# Patient Record
Sex: Female | Born: 1975 | Race: Black or African American | Hispanic: No | Marital: Single | Smoking: Never smoker
Health system: Southern US, Community
[De-identification: ages and names within clinical notes are randomized; demographics above are authoritative.]

## PROBLEM LIST (undated history)

## (undated) ENCOUNTER — Emergency Department (HOSPITAL_COMMUNITY): Disposition: A | Discharge status: Home / Self Care | Payer: Self-pay

## (undated) DIAGNOSIS — N921 Excessive and frequent menstruation with irregular cycle: Secondary | ICD-10-CM

## (undated) DIAGNOSIS — H9190 Unspecified hearing loss, unspecified ear: Secondary | ICD-10-CM

## (undated) DIAGNOSIS — D219 Benign neoplasm of connective and other soft tissue, unspecified: Secondary | ICD-10-CM

## (undated) DIAGNOSIS — IMO0002 Reserved for concepts with insufficient information to code with codable children: Secondary | ICD-10-CM

## (undated) DIAGNOSIS — IMO0001 Reserved for inherently not codable concepts without codable children: Secondary | ICD-10-CM

## (undated) HISTORY — DX: Excessive and frequent menstruation with irregular cycle: N92.1

---
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
## 2002-07-03 HISTORY — PX: MYOMECTOMY ABDOMINAL APPROACH: SUR870

## 2010-05-13 ENCOUNTER — Inpatient Hospital Stay (HOSPITAL_COMMUNITY): Admission: AD | Admit: 2010-05-13 | Discharge: 2010-05-13 | Payer: Self-pay | Admitting: Obstetrics & Gynecology

## 2010-05-14 ENCOUNTER — Inpatient Hospital Stay (HOSPITAL_COMMUNITY): Admission: AD | Admit: 2010-05-14 | Discharge: 2010-05-14 | Payer: Self-pay | Admitting: Obstetrics and Gynecology

## 2010-06-15 ENCOUNTER — Ambulatory Visit: Payer: Self-pay | Admitting: Obstetrics & Gynecology

## 2011-01-26 ENCOUNTER — Emergency Department (HOSPITAL_COMMUNITY)
Admission: EM | Admit: 2011-01-26 | Discharge: 2011-01-27 | Disposition: A | Discharge status: Home / Self Care | Payer: Medicaid Other | Attending: Emergency Medicine | Admitting: Emergency Medicine

## 2011-01-26 ENCOUNTER — Emergency Department (HOSPITAL_COMMUNITY): Payer: Medicaid Other

## 2011-01-26 DIAGNOSIS — R071 Chest pain on breathing: Secondary | ICD-10-CM | POA: Insufficient documentation

## 2011-01-26 DIAGNOSIS — X58XXXA Exposure to other specified factors, initial encounter: Secondary | ICD-10-CM | POA: Insufficient documentation

## 2011-01-26 DIAGNOSIS — M546 Pain in thoracic spine: Secondary | ICD-10-CM | POA: Insufficient documentation

## 2011-01-26 DIAGNOSIS — H9190 Unspecified hearing loss, unspecified ear: Secondary | ICD-10-CM | POA: Insufficient documentation

## 2011-01-26 DIAGNOSIS — S239XXA Sprain of unspecified parts of thorax, initial encounter: Secondary | ICD-10-CM | POA: Insufficient documentation

## 2011-01-26 LAB — COMPREHENSIVE METABOLIC PANEL
AST: 14 U/L (ref 0–37)
Albumin: 3.6 g/dL (ref 3.5–5.2)
Alkaline Phosphatase: 94 U/L (ref 39–117)
CO2: 24 mEq/L (ref 19–32)
Chloride: 103 mEq/L (ref 96–112)
GFR calc non Af Amer: >60 mL/min (ref >60)
Potassium: 3.6 mEq/L (ref 3.5–5.1)
Total Bilirubin: 0.1 mg/dL — ABNORMAL LOW (ref 0.3–1.2)

## 2011-01-26 LAB — CBC
Hemoglobin: 11.2 g/dL — ABNORMAL LOW (ref 12.0–15.0)
MCH: 23.8 pg — ABNORMAL LOW (ref 26.0–34.0)
MCV: 71.3 fL — ABNORMAL LOW (ref 78.0–100.0)
Platelets: 211 10*3/uL (ref 150–400)
RBC: 4.71 MIL/uL (ref 3.87–5.11)
WBC: 7.5 10*3/uL (ref 4.0–10.5)

## 2011-01-26 LAB — DIFFERENTIAL
Eosinophils Absolute: 0.1 10*3/uL (ref 0.0–0.7)
Lymphs Abs: 2 10*3/uL (ref 0.7–4.0)
Monocytes Relative: 5 % (ref 3–12)
Neutro Abs: 5 10*3/uL (ref 1.7–7.7)
Neutrophils Relative %: 67 % (ref 43–77)

## 2011-01-26 LAB — D-DIMER, QUANTITATIVE: D-Dimer, Quant: 0.26 ug/mL-FEU (ref 0.00–0.48)

## 2011-06-24 ENCOUNTER — Encounter: Payer: Self-pay | Admitting: *Deleted

## 2011-06-24 ENCOUNTER — Emergency Department (INDEPENDENT_AMBULATORY_CARE_PROVIDER_SITE_OTHER)
Admission: EM | Admit: 2011-06-24 | Discharge: 2011-06-24 | Disposition: A | Discharge status: Home Health | Payer: Medicaid Other | Source: Home / Self Care | Attending: Emergency Medicine | Admitting: Emergency Medicine

## 2011-06-24 DIAGNOSIS — N912 Amenorrhea, unspecified: Secondary | ICD-10-CM

## 2011-06-24 DIAGNOSIS — R51 Headache: Secondary | ICD-10-CM

## 2011-06-24 DIAGNOSIS — R5383 Other fatigue: Secondary | ICD-10-CM

## 2011-06-24 DIAGNOSIS — N926 Irregular menstruation, unspecified: Secondary | ICD-10-CM

## 2011-06-24 HISTORY — DX: Unspecified hearing loss, unspecified ear: H91.90

## 2011-06-24 HISTORY — DX: Reserved for concepts with insufficient information to code with codable children: IMO0002

## 2011-06-24 LAB — POCT I-STAT, CHEM 8
BUN: 17 mg/dL (ref 6–23)
Calcium, Ion: 1.14 mmol/L (ref 1.12–1.32)
Chloride: 105 mEq/L (ref 96–112)
Creatinine, Ser: 0.9 mg/dL (ref 0.50–1.10)
Glucose, Bld: 95 mg/dL (ref 70–99)
HCT: 41 % (ref 36.0–46.0)
Hemoglobin: 13.9 g/dL (ref 12.0–15.0)
Potassium: 4.1 mEq/L (ref 3.5–5.1)
Sodium: 138 mEq/L (ref 135–145)
TCO2: 27 mmol/L (ref 0–100)

## 2011-06-24 LAB — WET PREP, GENITAL
Clue Cells Wet Prep HPF POC: NONE SEEN
Trich, Wet Prep: NONE SEEN
WBC, Wet Prep HPF POC: NONE SEEN
Yeast Wet Prep HPF POC: NONE SEEN

## 2011-06-24 LAB — POCT URINALYSIS DIP (DEVICE)
Bilirubin Urine: NEGATIVE
Glucose, UA: NEGATIVE mg/dL
Ketones, ur: NEGATIVE mg/dL
Leukocytes, UA: NEGATIVE
Nitrite: NEGATIVE
Protein, ur: NEGATIVE mg/dL
Specific Gravity, Urine: 1.02 (ref 1.005–1.030)
Urobilinogen, UA: 0.2 mg/dL (ref 0.0–1.0)
pH: 5.5 (ref 5.0–8.0)

## 2011-06-24 LAB — POCT PREGNANCY, URINE: Preg Test, Ur: NEGATIVE

## 2011-06-24 LAB — TSH: TSH: 2.123 u[IU]/mL (ref 0.350–4.500)

## 2011-06-24 MED ORDER — ACETAMINOPHEN-CODEINE #2 300-15 MG PO TABS: 1.0000 | ORAL_TABLET | ORAL | Status: AC | PRN

## 2011-06-24 NOTE — ED Notes (Signed)
Deaf interpretor Tobi Bastos here with pt - [facility]

## 2011-06-24 NOTE — ED Provider Notes (Signed)
History     CSN: [id number]  Arrival date & time 06/24/11  1423   First MD Initiated Contact with Patient 06/24/11 1528      Chief Complaint  Patient presents with  . Headache  . Abdominal Pain  . Fatigue    (Consider location/radiation/quality/duration/timing/severity/associated sxs/prior treatment) Patient is a 35 y.o. female presenting with headaches and abdominal pain. The history is provided by the patient. Language Interpreter Used: Deaf- interpretor during interview and exam-  Headache The primary symptoms include headaches. The symptoms began more than 1 week ago. The symptoms are waxing and waning.  The headache is associated with weakness.  Additional symptoms include weakness and pain. Additional symptoms do not include irritability. Medical issues do not include seizures, drug use, hypertension or recent surgery.  Abdominal Pain The primary symptoms of the illness include abdominal pain, fatigue, vaginal discharge and vaginal bleeding. The primary symptoms of the illness do not include shortness of breath.  Symptoms associated with the illness do not include diaphoresis.    Past Medical History  Diagnosis Date  . Ulcer   . Deaf     History reviewed. No pertinent past surgical history.  History reviewed. No pertinent family history.  History  Substance Use Topics  . Smoking status: Never Smoker   . Smokeless tobacco: Not on file  . Alcohol Use: No    OB History    Grav Para Term Preterm Abortions TAB SAB Ect Mult Living                  Review of Systems  Constitutional: Positive for fatigue. Negative for diaphoresis and irritability.  HENT: Negative for neck pain.   Eyes: Negative for visual disturbance.  Respiratory: Negative for shortness of breath.   Cardiovascular: Negative for chest pain.  Gastrointestinal: Positive for abdominal pain.  Genitourinary: Positive for vaginal bleeding, vaginal discharge and menstrual problem. Negative for vaginal  pain.  Musculoskeletal: Negative for arthralgias and gait problem.  Neurological: Positive for weakness and headaches. Negative for tremors, syncope and numbness.    Allergies  Review of patient's allergies indicates no known allergies.  Home Medications   Current Outpatient Rx  Name Route Sig Dispense Refill  . ACETAMINOPHEN-CODEINE #2 300-15 MG PO TABS Oral Take 1 tablet by mouth every 4 (four) hours as needed for pain. 30 tablet 0    BP 129/78  Pulse 73  Temp(Src) 98.1 F (36.7 C) (Oral)  Resp 20  SpO2 100%  LMP 05/30/2011  Physical Exam  Nursing note and vitals reviewed. Constitutional: She appears well-developed and well-nourished. No distress.  HENT:  Head: Normocephalic.  Eyes: No scleral icterus.  Neck: No JVD present.  Cardiovascular: Normal rate.   No murmur heard. Pulmonary/Chest: Effort normal. No respiratory distress. She has no wheezes. She has no rales. She exhibits no tenderness.  Abdominal: Soft. She exhibits no distension and no mass. There is no tenderness. There is no guarding. Hernia confirmed negative in the right inguinal area and confirmed negative in the left inguinal area.  Genitourinary: There is bleeding around the vagina. No signs of injury around the vagina.  Lymphadenopathy:    She has no cervical adenopathy.  Skin: Skin is warm.    ED Course  Procedures (including critical care time)  Labs Reviewed  POCT URINALYSIS DIP (DEVICE) - Abnormal; Notable for the following:    Hgb urine dipstick LARGE (*)    All other components within normal limits  POCT I-STAT, CHEM 8  POCT PREGNANCY,  URINE  WET PREP, GENITAL  POCT URINALYSIS DIPSTICK  POCT PREGNANCY, URINE  I-STAT, CHEM 8  TSH  GC/CHLAMYDIA PROBE AMP, GENITAL   No results found.   1. Menstrual disorder   2. Headache   3. Fatigue       MDM  HA's and fatigue and irregular periods. Thyroid screening pending, discussed GN follow-up for comprehensive evaluation of irregular  menses.        Jimmie Molly, MD 06/24/11 [phone]

## 2011-06-24 NOTE — ED Notes (Signed)
Per pt onset of headache /generalized weakness/fatigue x 10 days - not eating well - per pt irregular menstrual cycle - 11/1st to 11/10  - 11/27 to 12/5  -  Onset of vaginal bleeding again today

## 2011-06-29 ENCOUNTER — Ambulatory Visit (INDEPENDENT_AMBULATORY_CARE_PROVIDER_SITE_OTHER): Payer: Self-pay | Admitting: Obstetrics and Gynecology

## 2011-06-29 ENCOUNTER — Encounter: Payer: Self-pay | Admitting: Obstetrics and Gynecology

## 2011-06-29 VITALS — BP 124/72 | HR 78 | Temp 96.8°F | Ht 65.0 in | Wt 170.5 lb

## 2011-06-29 DIAGNOSIS — N921 Excessive and frequent menstruation with irregular cycle: Secondary | ICD-10-CM

## 2011-06-29 DIAGNOSIS — N920 Excessive and frequent menstruation with regular cycle: Secondary | ICD-10-CM

## 2011-06-29 NOTE — Progress Notes (Signed)
S: Pt presents today for evaluation of heavy, irregular vag bleeding. She states she has had multiple episode of heavy vag bleeding in the past 2 months. Each time, she experiences pain and heavy bleeding with clots. She also states she feels weak and "just feels bad." She denies fever, dysuria, vag irritation, or any other sx at this time. O: VSS A&Ox3 in NAD Abd soft, slightly tender to palpation NL external genitalia Bimanual exam reveals uterus to be slightly enlarged and tender to palpation No obvious adnexal masses but tender to palpation bilaterally Labs reviewed from recent urgent care - NL TSH, neg GC/Chlamydia, NL chem-8. A/P: Menometrorrhagia: discussed with pt at length via interpreter. Will check CBC and get pelvic US. She will return about 2wks following the [place] for f/u. Discussed diet, activity, risks, and precautions.  Clinton Gallant. Nyajah Hyson III, DrHSc, MPAS, PA-C

## 2011-06-30 LAB — CBC
HCT: 36.3 % (ref 36.0–46.0)
MCV: 72.5 fL — ABNORMAL LOW (ref 78.0–100.0)
Platelets: 254 10*3/uL (ref 150–400)
RBC: 5.01 MIL/uL (ref 3.87–5.11)
WBC: 4.1 10*3/uL (ref 4.0–10.5)

## 2011-07-05 ENCOUNTER — Ambulatory Visit (HOSPITAL_COMMUNITY)
Admission: RE | Admit: 2011-07-05 | Discharge: 2011-07-05 | Disposition: A | Discharge status: Home / Self Care | Payer: Medicaid Other | Source: Ambulatory Visit | Attending: Obstetrics and Gynecology | Admitting: Obstetrics and Gynecology

## 2011-07-05 DIAGNOSIS — N938 Other specified abnormal uterine and vaginal bleeding: Secondary | ICD-10-CM | POA: Insufficient documentation

## 2011-07-05 DIAGNOSIS — N921 Excessive and frequent menstruation with irregular cycle: Secondary | ICD-10-CM

## 2011-07-05 DIAGNOSIS — N949 Unspecified condition associated with female genital organs and menstrual cycle: Secondary | ICD-10-CM | POA: Insufficient documentation

## 2011-07-17 ENCOUNTER — Encounter: Payer: Self-pay | Admitting: Obstetrics & Gynecology

## 2011-07-17 ENCOUNTER — Ambulatory Visit (INDEPENDENT_AMBULATORY_CARE_PROVIDER_SITE_OTHER): Payer: Medicaid Other | Admitting: Obstetrics & Gynecology

## 2011-07-17 VITALS — BP 127/74 | HR 84 | Temp 97.0°F | Wt 172.2 lb

## 2011-07-17 DIAGNOSIS — N920 Excessive and frequent menstruation with regular cycle: Secondary | ICD-10-CM

## 2011-07-17 DIAGNOSIS — N921 Excessive and frequent menstruation with irregular cycle: Secondary | ICD-10-CM | POA: Insufficient documentation

## 2011-07-17 HISTORY — DX: Excessive and frequent menstruation with irregular cycle: N92.1

## 2011-07-17 NOTE — Progress Notes (Signed)
History:  36 y.o.. here today for  followup evaluation for menometrorrhagia.  She is deaf and mute and there is a ASL interpreter present for this encounter.  She reports having a prolonged period in 06/2011 that lasted for two weeks, no other abnormal episodes.  No other symptoms.  The following portions of the patient's history were reviewed and updated as appropriate: allergies, current medications, past family history, past medical history, past social history, past surgical history and problem list.   Objective:  Physical Exam Blood pressure 127/74, pulse 84, temperature 97 F (36.1 C), temperature source Oral, weight 172 lb 3.2 oz (78.109 kg), last menstrual period 06/24/2011. Deferred  Labs and Imaging 07/05/2011 TRANSABDOMINAL AND TRANSVAGINAL ULTRASOUND OF PELVIS Technique:  Both transabdominal and transvaginal ultrasound examinations of the pelvis were performed. Transabdominal technique was performed for global imaging of the pelvis including uterus, ovaries, adnexal regions, and pelvic cul-de-sac.  Comparison: None.   It was necessary to proceed with endovaginal exam following the transabdominal exam to visualize the endometrium and adnexa.  Findings:  Uterus: Demonstrates a sagittal length of 8.9 cm, an AP depth of 5.3 cm and a transverse width of 7.1 cm.  The myometrium appears mildly heterogeneous with no focal parenchymal abnormalities seen  Endometrium: Is tri- layered with an AP width of 10 mm.  No areas of focal thickening or heterogeneity are identified and the appearance would correlate with a periovulatory endometrial stripe and correspond with the patient's given LMP of 06/24/2011  Right ovary:  Measures 4.2 x 2.3 x 2.7 cm and contains a dominant follicle  Left ovary: Measures 4.4 by 1.8 x 1.5 cm and has a normal appearance  Other findings: No pelvic fluid or separate adnexal masses are seen.  IMPRESSION: Normal periovulatory pelvic ultrasound.  POCT URINALYSIS DIP (DEVICE)   Collection Time   06/24/11  4:23 PM      Component Value Range   Glucose, UA NEGATIVE  NEGATIVE (mg/dL)   Bilirubin Urine NEGATIVE  NEGATIVE    Ketones, ur NEGATIVE  NEGATIVE (mg/dL)   Specific Gravity, Urine 1.020  1.005 - 1.030    Hgb urine dipstick LARGE (*) NEGATIVE    pH 5.5  5.0 - 8.0    Protein, ur NEGATIVE  NEGATIVE (mg/dL)   Urobilinogen, UA 0.2  0.0 - 1.0 (mg/dL)   Nitrite NEGATIVE  NEGATIVE    Leukocytes, UA NEGATIVE  NEGATIVE   TSH   Collection Time   06/24/11  4:24 PM      Component Value Range   TSH 2.123  0.350 - 4.500 (uIU/mL)  POCT I-STAT, CHEM 8   Collection Time   06/24/11  4:36 PM      Component Value Range   Sodium 138  135 - 145 (mEq/L)   Potassium 4.1  3.5 - 5.1 (mEq/L)   Chloride 105  96 - 112 (mEq/L)   BUN 17  6 - 23 (mg/dL)   Creatinine, Ser 1.61  0.50 - 1.10 (mg/dL)   Glucose, Bld 95  70 - 99 (mg/dL)   Calcium, Ion 0.96  0.45 - 1.32 (mmol/L)   TCO2 27  0 - 100 (mmol/L)   Hemoglobin 13.9  12.0 - 15.0 (g/dL)   HCT 40.9  81.1 - 91.4 (%)  POCT PREGNANCY, URINE   Collection Time   06/24/11  4:36 PM      Component Value Range   Preg Test, Ur NEGATIVE    GC/CHLAMYDIA PROBE AMP, GENITAL   Collection Time   06/24/11  5:03 PM      Component Value Range   GC Probe Amp, Genital NEGATIVE  NEGATIVE    Chlamydia, DNA Probe NEGATIVE  NEGATIVE   WET PREP, GENITAL   Collection Time   06/24/11  5:03 PM      Component Value Range   Yeast, Wet Prep NONE SEEN  NONE SEEN    Trich, Wet Prep NONE SEEN  NONE SEEN    Clue Cells, Wet Prep NONE SEEN  NONE SEEN    WBC, Wet Prep HPF POC NONE SEEN  NONE SEEN   CBC   Collection Time   06/29/11  2:03 PM      Component Value Range   WBC 4.1  4.0 - 10.5 (K/uL)   RBC 5.01  3.87 - 5.11 (MIL/uL)   Hemoglobin 11.7 (*) 12.0 - 15.0 (g/dL)   HCT 60.4  54.0 - 98.1 (%)   MCV 72.5 (*) 78.0 - 100.0 (fL)   MCH 23.4 (*) 26.0 - 34.0 (pg)   MCHC 32.2  30.0 - 36.0 (g/dL)   RDW 19.1  47.8 - 29.5 (%)   Platelets 254  150 - 400  (K/uL)     Assessment & Plan:  Menometrorrhagia, no structural anomalies, normal labs If bleeding persists, patient will need endometrial biopsy and further discussion about management. She declines any management for now. Bleeding precautions reviewed. Return for annual exam and pap smear

## 2011-07-17 NOTE — Patient Instructions (Signed)

## 2011-08-04 ENCOUNTER — Ambulatory Visit: Payer: Medicaid Other | Admitting: Obstetrics and Gynecology

## 2011-08-09 ENCOUNTER — Emergency Department (HOSPITAL_COMMUNITY)
Admission: EM | Admit: 2011-08-09 | Discharge: 2011-08-09 | Disposition: A | Discharge status: Home / Self Care | Payer: Medicaid Other | Attending: Emergency Medicine | Admitting: Emergency Medicine

## 2011-08-09 DIAGNOSIS — L259 Unspecified contact dermatitis, unspecified cause: Secondary | ICD-10-CM | POA: Insufficient documentation

## 2011-08-09 DIAGNOSIS — L298 Other pruritus: Secondary | ICD-10-CM | POA: Insufficient documentation

## 2011-08-09 DIAGNOSIS — R21 Rash and other nonspecific skin eruption: Secondary | ICD-10-CM | POA: Insufficient documentation

## 2011-08-09 DIAGNOSIS — L2989 Other pruritus: Secondary | ICD-10-CM | POA: Insufficient documentation

## 2011-08-09 DIAGNOSIS — L309 Dermatitis, unspecified: Secondary | ICD-10-CM

## 2011-08-09 MED ORDER — HYDROCORTISONE 2.5 % EX LOTN: TOPICAL_LOTION | Freq: Two times a day (BID) | CUTANEOUS | Status: DC

## 2011-08-09 NOTE — ED Notes (Signed)
Secretary was asked to call interpreter for american sign language on pt arrival to ED.  I am communicating with pt by writing and notified her that a interpreter has been called for her

## 2011-08-09 NOTE — ED Notes (Signed)
Pt is here for rash on her left leg, this is a chronic issue she has had since she was a girl.  She states that this area is scarred up most of the time but opens up in the winter time.  The area is itchy to her

## 2011-08-09 NOTE — ED Provider Notes (Signed)
History     CSN: [id number]  Arrival date & time 08/09/11  1153   First MD Initiated Contact with Patient 08/09/11 1421      Chief Complaint  Patient presents with  . Rash    (Consider location/radiation/quality/duration/timing/severity/associated sxs/prior treatment) Patient is a 36 y.o. female presenting with rash. The history is provided by the patient.  Rash    patient here with a rash on her left posterior lower thigh which she has had for some time. The rash is pruritic his recurrent and worsening with time or try climates. No diagnosis made for this has not use any medications prior to arrival for this. Nothing makes her symptoms better. Her left thigh is the only place where she has the rash. Denies any new soaps, detergents, lotions.  Past Medical History  Diagnosis Date  . Ulcer   . Deaf   . Menometrorrhagia 07/17/2011    Past Surgical History  Procedure Date  . Cesarean section   . Appendectomy     Family History  Problem Relation Age of Onset  . Seizures Sister     History  Substance Use Topics  . Smoking status: Never Smoker   . Smokeless tobacco: Not on file  . Alcohol Use: 0.0 oz/week    0 Glasses of wine per week    OB History    Grav Para Term Preterm Abortions TAB SAB Ect Mult Living   2 2 2  0 0 0 0 0 0 2      Review of Systems  Skin: Positive for rash.  All other systems reviewed and are negative.    Allergies  Advil  Home Medications   Current Outpatient Rx  Name Route Sig Dispense Refill  . HYDROCORTISONE 2.5 % EX LOTN Topical Apply topically 2 (two) times daily. 59 mL 0    BP 122/70  Pulse 81  Temp(Src) 98.1 F (36.7 C) (Oral)  Resp 16  SpO2 97%  Physical Exam  Nursing note and vitals reviewed. Constitutional: She is oriented to person, place, and time. She appears well-developed and well-nourished.  Non-toxic appearance.  HENT:  Head: Normocephalic and atraumatic.  Eyes: Conjunctivae are normal. Pupils are equal,  round, and reactive to light.  Neck: Normal range of motion.  Cardiovascular: Normal rate.   Pulmonary/Chest: Effort normal.  Neurological: She is alert and oriented to person, place, and time.  Skin: Skin is warm and dry. Rash noted. Rash is not pustular and not urticarial. No erythema.     Psychiatric: She has a normal mood and affect.    ED Course  Procedures (including critical care time)  Labs Reviewed - No data to display No results found.   1. Dermatitis       MDM  Patient being treated for presumed eczema versus dermatitis        Toy Baker, MD 08/09/11 1441

## 2011-08-09 NOTE — ED Notes (Signed)
acutity 3 due to need for written communication at this time

## 2011-08-16 ENCOUNTER — Ambulatory Visit (INDEPENDENT_AMBULATORY_CARE_PROVIDER_SITE_OTHER): Payer: Medicaid Other | Admitting: Advanced Practice Midwife

## 2011-08-16 ENCOUNTER — Encounter: Payer: Self-pay | Admitting: Advanced Practice Midwife

## 2011-08-16 ENCOUNTER — Other Ambulatory Visit (HOSPITAL_COMMUNITY)
Admission: RE | Admit: 2011-08-16 | Discharge: 2011-08-16 | Disposition: A | Discharge status: Home / Self Care | Payer: Medicaid Other | Source: Ambulatory Visit | Attending: Advanced Practice Midwife | Admitting: Advanced Practice Midwife

## 2011-08-16 VITALS — BP 131/83 | HR 76 | Temp 97.4°F | Resp 16 | Ht 64.0 in | Wt 174.7 lb

## 2011-08-16 DIAGNOSIS — Z01419 Encounter for gynecological examination (general) (routine) without abnormal findings: Secondary | ICD-10-CM

## 2011-08-16 DIAGNOSIS — Z3009 Encounter for other general counseling and advice on contraception: Secondary | ICD-10-CM

## 2011-08-16 DIAGNOSIS — Z30011 Encounter for initial prescription of contraceptive pills: Secondary | ICD-10-CM

## 2011-08-16 DIAGNOSIS — Z23 Encounter for immunization: Secondary | ICD-10-CM

## 2011-08-16 MED ORDER — NORETHINDRONE-ETH ESTRADIOL 0.4-35 MG-MCG PO TABS: 1.0000 | ORAL_TABLET | Freq: Every day | ORAL | Status: DC

## 2011-08-16 MED ORDER — INFLUENZA VIRUS VACC SPLIT PF IM SUSP
0.5000 mL | INTRAMUSCULAR | Status: AC | PRN
  Administered 2011-08-16: 0.5 mL via INTRAMUSCULAR

## 2011-08-16 NOTE — Progress Notes (Signed)
  Subjective:     Ashley Erickson is a 36 y.o. female here for a routine exam.  Current complaints: Heavy bleeding is improved.  Did start period today.  Personal health questionnaire reviewed: not asked.   Gynecologic History Patient's last menstrual period was 08/16/2011. Contraception: condoms  May want to use another method later Last Pap: never. Results were: n/a  Last mammogram: never. Results were: n/a  Obstetric History OB History    Grav Para Term Preterm Abortions TAB SAB Ect Mult Living   2 2 2  0 0 0 0 0 0 2     # Outc Date GA Lbr Len/2nd Wgt Sex Del Anes PTL Lv   1 TRM 5/04    M SVD      2 TRM 9/08    F LTCS          The following portions of the patient's history were reviewed and updated as appropriate: allergies, current medications, past family history, past medical history, past social history, past surgical history and problem list.  Review of Systems Pertinent items are noted in HPI.    Objective:    General appearance: alert, cooperative, no distress and deaf, uses ASL interpretor Neck: no adenopathy, no JVD, supple, symmetrical, trachea midline and thyroid not enlarged, symmetric, no tenderness/mass/nodules Back: symmetric, no curvature. ROM normal. No CVA tenderness. Lungs: clear to auscultation bilaterally Breasts: normal appearance, no masses or tenderness Heart: regular rate and rhythm, S1, S2 normal, no murmur, click, rub or gallop Abdomen: soft, non-tender; bowel sounds normal; no masses,  no organomegaly Pelvic: cervix normal in appearance, external genitalia normal, no adnexal masses or tenderness, no cervical motion tenderness, rectovaginal septum normal, uterus normal size, shape, and consistency and vagina normal with menstrual blood Extremities: extremities normal, atraumatic, no cyanosis or edema Skin: Skin color, texture, turgor normal. No rashes or lesions    Assessment:    Healthy female exam.    Plan:    Education reviewed: safe  sex/STD prevention and self breast exams. Contraception: OCP (estrogen/progesterone). Follow up in: 1 month. for BP check only  Pap sent GC/Chlam/Wet prep done per request

## 2011-08-16 NOTE — Progress Notes (Signed)
Interpreters present during visit.  Laurette Schimke- student interpreter,  Angelina Pih- interpreter.

## 2011-08-16 NOTE — Patient Instructions (Addendum)
Contraception Choices Birth control (contraception) can stop pregnancy from happening. Different types of birth control work in different ways. Some can:  Make the mucus in the cervix thick. This makes it hard for sperm to get into the uterus.   Thin the lining of the uterus. This makes it hard for an egg to attach to the wall of the uterus.   Stop the ovaries from releasing an egg.   Block the sperm from reaching the egg.  Certain types of surgery can stop pregnancy from happening. For women, the sugery closes the fallopian tubes (tubal ligation). For men, the surgery stops sperm from releasing during sex (vasectomy). HORMONAL BIRTH CONTROL Hormonal birth control stops pregnancy by putting hormones into your body. Types of birth control include:  A small tube put under the skin of the upper arm (implant). The tube can stay in place for 3 years.   Shots given every 3 months.   Pills taken every day or once after sex (intercourse).   Patches that are changed once a week.   A ring put into the vagina (vaginal ring). The ring is left in place for 3 weeks and removed for 1 week. Then, a new ring is put in the vagina.  BARRIER BIRTH CONTROL  Barrier birth control blocks sperm from reaching the egg. Types of birth control include:   A thin covering worn on the penis (female condom) during sex.   A soft, loose covering put into the vagina (female condom) before sex.   A rubber bowl that sits over the cervix (diaphragm). The bowl must be made for you. The bowl is put into the vagina before sex. The bowl is left in place for 6 to 8 hours after sex.   A small, soft cup that fits over the cervix (cervical cap). The cup must be made for you. The cup can be left in place for 48 hours after sex.   A sponge that is put into the vagina before sex.   A chemical that kills or blocks sperm from getting into the cervix and uterus (spermicide). The chemical may be a cream, jelly, foam, or pill.    INTRAUTERINE (IUD) BIRTH CONTROL  IUD birth control is a small, T-shaped piece of plastic. The plastic is put inside the uterus. There are 2 types of IUD:  Copper IUD. The IUD is covered in copper wire. The copper makes a fluid that kills sperm. It can stay in place for 10 years.   Hormone IUD. The hormone stops pregnancy from happening. It can stay in place for 5 years.  NATURAL FAMILY PLANNING BIRTH CONTROL  Natural family planning means not having sex or using barrier birth control when the woman is fertile. A woman can:  Use a calendar to keep track of when she is fertile.   Use a thermometer to measure her body temperature.  Protect yourself against sexual diseases no matter what type of birth control you use. Talk to your doctor about which type of birth control is best for you. Document Released: 04/16/2009 Document Revised: 03/01/2011 Document Reviewed: 10/26/2010 [facility] Patient Information 2012 [place], [place]. Health Maintenance, Females A healthy lifestyle and preventative care can promote health and wellness.  Maintain regular health, dental, and eye exams.   Eat a healthy diet. Foods like vegetables, fruits, whole grains, low-fat dairy products, and lean protein foods contain the nutrients you need without too many calories. Decrease your intake of foods high in solid fats, added sugars, and  salt. Get information about a proper diet from your caregiver, if necessary.   Regular physical exercise is one of the most important things you can do for your health. Most adults should get at least 150 minutes of moderate-intensity exercise (any activity that increases your heart rate and causes you to sweat) each week. In addition, most adults need muscle-strengthening exercises on 2 or more days a week.    Maintain a healthy weight. The body mass index (BMI) is a screening tool to identify possible weight problems. It provides an estimate of body fat based on height and weight. Your  caregiver can help determine your BMI, and can help you achieve or maintain a healthy weight. For adults 20 years and older:   A BMI below 18.5 is considered underweight.   A BMI of 18.5 to 24.9 is normal.   A BMI of 25 to 29.9 is considered overweight.   A BMI of 30 and above is considered obese.   Maintain normal blood lipids and cholesterol by exercising and minimizing your intake of saturated fat. Eat a balanced diet with plenty of fruits and vegetables. Blood tests for lipids and cholesterol should begin at age 60 and be repeated every 5 years. If your lipid or cholesterol levels are high, you are over 50, or you are a high risk for heart disease, you may need your cholesterol levels checked more frequently.Ongoing high lipid and cholesterol levels should be treated with medicines if diet and exercise are not effective.   If you smoke, find out from your caregiver how to quit. If you do not use tobacco, do not start.   If you are pregnant, do not drink alcohol. If you are breastfeeding, be very cautious about drinking alcohol. If you are not pregnant and choose to drink alcohol, do not exceed 1 drink per day. One drink is considered to be 12 ounces (355 mL) of beer, 5 ounces (148 mL) of wine, or 1.5 ounces (44 mL) of liquor.   Avoid use of street drugs. Do not share needles with anyone. Ask for help if you need support or instructions about stopping the use of drugs.   High blood pressure causes heart disease and increases the risk of stroke. Blood pressure should be checked at least every 1 to 2 years. Ongoing high blood pressure should be treated with medicines, if weight loss and exercise are not effective.   If you are 33 to 38 years old, ask your caregiver if you should take aspirin to prevent strokes.   Diabetes screening involves taking a blood sample to check your fasting blood sugar level. This should be done once every 3 years, after age 63, if you are within normal weight and  without risk factors for diabetes. Testing should be considered at a younger age or be carried out more frequently if you are overweight and have at least 1 risk factor for diabetes.   Breast cancer screening is essential preventative care for women. You should practice "breast self-awareness." This means understanding the normal appearance and feel of your breasts and may include breast self-examination. Any changes detected, no matter how small, should be reported to a caregiver. Women in their 44s and 30s should have a clinical breast exam (CBE) by a caregiver as part of a regular health exam every 1 to 3 years. After age 75, women should have a CBE every year. Starting at age 52, women should consider having a mammogram (breast X-ray) every year. Women who  have a family history of breast cancer should talk to their caregiver about genetic screening. Women at a high risk of breast cancer should talk to their caregiver about having an MRI and a mammogram every year.   The Pap test is a screening test for cervical cancer. Women should have a Pap test starting at age 50. Between ages 100 and 78, Pap tests should be repeated every 2 years. Beginning at age 85, you should have a Pap test every 3 years as long as the past 3 Pap tests have been normal. If you had a hysterectomy for a problem that was not cancer or a condition that could lead to cancer, then you no longer need Pap tests. If you are between ages 11 and 27, and you have had normal Pap tests going back 10 years, you no longer need Pap tests. If you have had past treatment for cervical cancer or a condition that could lead to cancer, you need Pap tests and screening for cancer for at least 20 years after your treatment. If Pap tests have been discontinued, risk factors (such as a new sexual partner) need to be reassessed to determine if screening should be resumed. Some women have medical problems that increase the chance of getting cervical cancer. In  these cases, your caregiver may recommend more frequent screening and Pap tests.   The human papillomavirus (HPV) test is an additional test that may be used for cervical cancer screening. The HPV test looks for the virus that can cause the cell changes on the cervix. The cells collected during the Pap test can be tested for HPV. The HPV test could be used to screen women aged 61 years and older, and should be used in women of any age who have unclear Pap test results. After the age of 68, women should have HPV testing at the same frequency as a Pap test.   Colorectal cancer can be detected and often prevented. Most routine colorectal cancer screening begins at the age of 31 and continues through age 51. However, your caregiver may recommend screening at an earlier age if you have risk factors for colon cancer. On a yearly basis, your caregiver may provide home test kits to check for hidden blood in the stool. Use of a small camera at the end of a tube, to directly examine the colon (sigmoidoscopy or colonoscopy), can detect the earliest forms of colorectal cancer. Talk to your caregiver about this at age 64, when routine screening begins. Direct examination of the colon should be repeated every 5 to 10 years through age 68, unless early forms of pre-cancerous polyps or small growths are found.   Practice safe sex. Use condoms and avoid high-risk sexual practices to reduce the spread of sexually transmitted infections (STIs). Sexually active women aged 26 and younger should be checked for Chlamydia, which is a common sexually transmitted infection. Older women with new or multiple partners should also be tested for Chlamydia. Testing for other STIs is recommended if you are sexually active and at increased risk.   Osteoporosis is a disease in which the bones lose minerals and strength with aging. This can result in serious bone fractures. The risk of osteoporosis can be identified using a bone density scan.  Women ages 16 and over and women at risk for fractures or osteoporosis should discuss screening with their caregivers. Ask your caregiver whether you should be taking a calcium supplement or vitamin D to reduce the rate of osteoporosis.  Menopause can be associated with physical symptoms and risks. Hormone replacement therapy is available to decrease symptoms and risks. You should talk to your caregiver about whether hormone replacement therapy is right for you.   Use sunscreen with a sun protection factor (SPF) of 30 or greater. Apply sunscreen liberally and repeatedly throughout the day. You should seek shade when your shadow is shorter than you. Protect yourself by wearing long sleeves, pants, a wide-brimmed hat, and sunglasses year round, whenever you are outdoors.   Notify your caregiver of new moles or changes in moles, especially if there is a change in shape or color. Also notify your caregiver if a mole is larger than the size of a pencil eraser.   Stay current with your immunizations.  Document Released: 01/02/2011 Document Revised: 03/01/2011 Document Reviewed: 01/02/2011 [facility] Patient Information 2012 [place], [place].

## 2011-08-17 LAB — WET PREP, GENITAL
Trich, Wet Prep: NONE SEEN
WBC, Wet Prep HPF POC: NONE SEEN
Yeast Wet Prep HPF POC: NONE SEEN

## 2011-08-17 LAB — GC/CHLAMYDIA PROBE AMP, GENITAL: GC Probe Amp, Genital: NEGATIVE

## 2011-10-06 ENCOUNTER — Ambulatory Visit
Admission: RE | Admit: 2011-10-06 | Discharge: 2011-10-06 | Disposition: A | Discharge status: Home / Self Care | Payer: Medicaid Other | Source: Ambulatory Visit | Attending: Specialist | Admitting: Specialist

## 2011-10-06 ENCOUNTER — Other Ambulatory Visit: Payer: Self-pay | Admitting: Specialist

## 2011-10-06 DIAGNOSIS — R7611 Nonspecific reaction to tuberculin skin test without active tuberculosis: Secondary | ICD-10-CM

## 2011-10-06 DIAGNOSIS — R6889 Other general symptoms and signs: Secondary | ICD-10-CM

## 2011-10-09 ENCOUNTER — Inpatient Hospital Stay (HOSPITAL_COMMUNITY)
Admission: AD | Admit: 2011-10-09 | Discharge: 2011-10-09 | Disposition: A | Discharge status: Home / Self Care | Payer: Medicaid Other | Source: Ambulatory Visit | Attending: Obstetrics and Gynecology | Admitting: Obstetrics and Gynecology

## 2011-10-09 ENCOUNTER — Encounter (HOSPITAL_COMMUNITY): Payer: Self-pay

## 2011-10-09 DIAGNOSIS — N390 Urinary tract infection, site not specified: Secondary | ICD-10-CM

## 2011-10-09 DIAGNOSIS — B9689 Other specified bacterial agents as the cause of diseases classified elsewhere: Secondary | ICD-10-CM

## 2011-10-09 DIAGNOSIS — A499 Bacterial infection, unspecified: Secondary | ICD-10-CM | POA: Insufficient documentation

## 2011-10-09 DIAGNOSIS — N760 Acute vaginitis: Secondary | ICD-10-CM | POA: Insufficient documentation

## 2011-10-09 DIAGNOSIS — N949 Unspecified condition associated with female genital organs and menstrual cycle: Secondary | ICD-10-CM | POA: Insufficient documentation

## 2011-10-09 HISTORY — DX: Benign neoplasm of connective and other soft tissue, unspecified: D21.9

## 2011-10-09 LAB — WET PREP, GENITAL

## 2011-10-09 LAB — URINALYSIS, ROUTINE W REFLEX MICROSCOPIC
Bilirubin Urine: NEGATIVE
Hgb urine dipstick: NEGATIVE
Ketones, ur: NEGATIVE mg/dL
Protein, ur: NEGATIVE mg/dL
Urobilinogen, UA: 0.2 mg/dL (ref 0.0–1.0)

## 2011-10-09 LAB — URINE MICROSCOPIC-ADD ON

## 2011-10-09 LAB — POCT PREGNANCY, URINE: Preg Test, Ur: NEGATIVE

## 2011-10-09 MED ORDER — METRONIDAZOLE 500 MG PO TABS: 500.0000 mg | ORAL_TABLET | Freq: Two times a day (BID) | ORAL | Status: AC

## 2011-10-09 MED ORDER — SULFAMETHOXAZOLE-TRIMETHOPRIM 800-160 MG PO TABS: 1.0000 | ORAL_TABLET | Freq: Two times a day (BID) | ORAL | Status: AC

## 2011-10-09 NOTE — Discharge Instructions (Signed)
Take the medication as directed and call the GYN office [phone] to schedule a follow up appointment. Return here as needed.

## 2011-10-09 NOTE — MAU Note (Signed)
Patient is deaf. Patient wrote down for nurse that she does not want a deaf interpreter; wants to communicate with staff by writing things down.

## 2011-10-09 NOTE — MAU Note (Signed)
Pt states has yellow malodorous discharge since March 31. That is the last time she had intercourse. States the intercourse was painful and caused vaginal bleeding. States the bleeding turned into the yellow discharge.

## 2011-10-09 NOTE — MAU Provider Note (Signed)
History     CSN: [id number]  Arrival date and time: 10/09/11 1837   None     No chief complaint on file.  HPI 36 y.o. [id number] with yellow vaginal discharge, itching and odor. Pt is hearing impaired, declines interpreter.     Past Medical History  Diagnosis Date  . Ulcer   . Deaf   . Menometrorrhagia 07/17/2011  . Fibroid     Past Surgical History  Procedure Date  . Appendectomy 1994  . Cesarean section 2008  . Myomectomy abdominal approach 2004    Family History  Problem Relation Age of Onset  . Seizures Sister   . Anesthesia problems Neg Hx     History  Substance Use Topics  . Smoking status: Never Smoker   . Smokeless tobacco: Not on file  . Alcohol Use: 0.0 oz/week    0 Glasses of wine per week    Allergies:  Allergies  Allergen Reactions  . Childrens Advil Other (See Comments)    Patient states intolerance to it-- had GI disturbances    No prescriptions prior to admission    Review of Systems  Constitutional: Negative.   Respiratory: Negative.   Cardiovascular: Negative.   Gastrointestinal: Negative for nausea, vomiting, abdominal pain, diarrhea and constipation.  Genitourinary: Negative for dysuria, urgency, frequency, hematuria and flank pain.       Negative for vaginal bleeding, Positive for vaginal discharge  Musculoskeletal: Negative.   Neurological: Negative.   Psychiatric/Behavioral: Negative.    Physical Exam   Blood pressure 137/80, pulse 116, height 5\' 5"  (1.651 m), weight 181 lb 6 oz (82.271 kg), last menstrual period 09/10/2011.  Physical Exam  Constitutional: She is oriented to person, place, and time. She appears well-developed and well-nourished. No distress.  HENT:  Head: Normocephalic and atraumatic.  Cardiovascular: Normal rate, regular rhythm and normal heart sounds.   Respiratory: Effort normal and breath sounds normal. No respiratory distress.  GI: Soft. Bowel sounds are normal. She exhibits no distension and no mass.  There is no tenderness. There is no rebound and no guarding.  Genitourinary: There is no rash or lesion on the right labia. There is no rash or lesion on the left labia. Uterus is not deviated, not enlarged, not fixed and not tender. Cervix exhibits no motion tenderness, no discharge and no friability. Right adnexum displays no mass, no tenderness and no fullness. Left adnexum displays no mass, no tenderness and no fullness. No erythema, tenderness or bleeding around the vagina. Vaginal discharge (yellowish, malodorous) found.  Neurological: She is alert and oriented to person, place, and time.  Skin: Skin is warm and dry.  Psychiatric: She has a normal mood and affect.    MAU Course  Procedures  Results for orders placed during the hospital encounter of 10/09/11 (from the past 24 hour(s))  URINALYSIS, ROUTINE W REFLEX MICROSCOPIC     Status: Abnormal   Collection Time   10/09/11  6:40 PM      Component Value Range   Color, Urine YELLOW  YELLOW    APPearance CLEAR  CLEAR    Specific Gravity, Urine 1.020  1.005 - 1.030    pH 6.0  5.0 - 8.0    Glucose, UA NEGATIVE  NEGATIVE (mg/dL)   Hgb urine dipstick NEGATIVE  NEGATIVE    Bilirubin Urine NEGATIVE  NEGATIVE    Ketones, ur NEGATIVE  NEGATIVE (mg/dL)   Protein, ur NEGATIVE  NEGATIVE (mg/dL)   Urobilinogen, UA 0.2  0.0 - 1.0 (mg/dL)  Nitrite NEGATIVE  NEGATIVE    Leukocytes, UA MODERATE (*) NEGATIVE   URINE MICROSCOPIC-ADD ON     Status: Abnormal   Collection Time   10/09/11  6:40 PM      Component Value Range   Squamous Epithelial / LPF FEW (*) RARE    WBC, UA 11-20  <3 (WBC/hpf)   RBC / HPF 3-6  <3 (RBC/hpf)   Bacteria, UA FEW (*) RARE    Urine-Other MUCOUS PRESENT    POCT PREGNANCY, URINE     Status: Normal   Collection Time   10/09/11  7:30 PM      Component Value Range   Preg Test, Ur NEGATIVE  NEGATIVE   WET PREP, GENITAL     Status: Abnormal   Collection Time   10/09/11  7:39 PM      Component Value Range   Yeast Wet Prep HPF  POC NONE SEEN  NONE SEEN    Trich, Wet Prep NONE SEEN  NONE SEEN    Clue Cells Wet Prep HPF POC FEW (*) NONE SEEN    WBC, Wet Prep HPF POC MODERATE (*) NONE SEEN      Assessment and Plan  36 y.o. [id number] with vaginal discharge Awaiting wet prep, gc/ct pending Care assumed by [facility], NP  Jackalyn Haith 10/09/2011, 9:00 PM   Assessment:  UTI   Bacterial vaginosis  Plan:  Septra DS Rx   Flagyl Rx   Return as needed

## 2011-10-10 NOTE — MAU Provider Note (Signed)
Agree with above note.  Ashley Erickson 10/10/2011 7:17 AM

## 2012-08-05 ENCOUNTER — Ambulatory Visit (INDEPENDENT_AMBULATORY_CARE_PROVIDER_SITE_OTHER): Payer: Medicaid Other | Admitting: Family Medicine

## 2012-08-05 ENCOUNTER — Encounter: Payer: Self-pay | Admitting: Family Medicine

## 2012-08-05 VITALS — BP 135/87 | HR 91 | Temp 98.6°F | Ht 66.0 in | Wt 176.0 lb

## 2012-08-05 DIAGNOSIS — Z01419 Encounter for gynecological examination (general) (routine) without abnormal findings: Secondary | ICD-10-CM

## 2012-08-05 DIAGNOSIS — Z0000 Encounter for general adult medical examination without abnormal findings: Secondary | ICD-10-CM

## 2012-08-05 DIAGNOSIS — L259 Unspecified contact dermatitis, unspecified cause: Secondary | ICD-10-CM

## 2012-08-05 DIAGNOSIS — L309 Dermatitis, unspecified: Secondary | ICD-10-CM

## 2012-08-05 DIAGNOSIS — R030 Elevated blood-pressure reading, without diagnosis of hypertension: Secondary | ICD-10-CM

## 2012-08-05 LAB — COMPREHENSIVE METABOLIC PANEL
CO2: 24 mEq/L (ref 19–32)
Glucose, Bld: 99 mg/dL (ref 70–99)
Sodium: 137 mEq/L (ref 135–145)
Total Bilirubin: 0.3 mg/dL (ref 0.3–1.2)
Total Protein: 7.1 g/dL (ref 6.0–8.3)

## 2012-08-05 LAB — CBC
MCV: 70.5 fL — ABNORMAL LOW (ref 78.0–100.0)
Platelets: 280 10*3/uL (ref 150–400)
RBC: 5.28 MIL/uL — ABNORMAL HIGH (ref 3.87–5.11)
WBC: 5 10*3/uL (ref 4.0–10.5)

## 2012-08-05 LAB — TSH: TSH: 1.282 u[IU]/mL (ref 0.350–4.500)

## 2012-08-05 NOTE — Patient Instructions (Signed)
Everything looks good!  It was very nice to meet you today.    I will see you tomorrow and look at your leg tomorrow.  Lab work today.

## 2012-08-06 ENCOUNTER — Telehealth: Payer: Self-pay | Admitting: Family Medicine

## 2012-08-06 DIAGNOSIS — L309 Dermatitis, unspecified: Secondary | ICD-10-CM | POA: Insufficient documentation

## 2012-08-06 MED ORDER — FERROUS SULFATE 325 (65 FE) MG PO TABS: 325.0000 mg | ORAL_TABLET | Freq: Every day | ORAL | Status: DC

## 2012-08-06 MED ORDER — DESONIDE 0.05 % EX CREA: TOPICAL_CREAM | Freq: Two times a day (BID) | CUTANEOUS | Status: DC

## 2012-08-06 NOTE — Telephone Encounter (Signed)
Mom presented today with her children for [facility].  I was able to go over with her the labwork from yesterday, including the evidence of iron deficiency anemia.  Recommended to start 1 a day iron and will send this in.  Recheck in 3 months or so.  No symptoms currently.

## 2012-08-06 NOTE — Assessment & Plan Note (Signed)
Desonide for relief.

## 2012-08-07 ENCOUNTER — Encounter: Payer: Self-pay | Admitting: Family Medicine

## 2012-08-07 DIAGNOSIS — H9190 Unspecified hearing loss, unspecified ear: Secondary | ICD-10-CM | POA: Insufficient documentation

## 2012-08-07 NOTE — Assessment & Plan Note (Signed)
Normal pap performed at [facility]. Would like to think about tetanus/flu shots.   FU in 1 year for next well women exam.  FU prn if any concerns

## 2012-08-07 NOTE — Progress Notes (Signed)
Patient ID: Ashley Erickson, female   DOB: 1975-09-13, 37 y.o.   MRN: [id number] Ashley Erickson is a 37 y.o. female who presents to [facility] today to establish care.  She arrived in the [place] in 2011 as a refugee from [place], seeking political asylum due to discrimination from her religious beliefs and disability.  She has congenital deafness.    She has no chronic medical conditions about which she knows.  No significant past medical history except for recent menometrorrhagia, which has now resolved.    Does have rash located popliteal region of Left knee.  Very itchy.  Has tried OTC hydrocortisone cream without relief.  No recent illnesses.   Patient has ASL interpreter here and present for entire visit.    The following portions of the patient's history were reviewed and updated as appropriate: allergies, current medications, past medical history, family and social history, and problem list.  Patient is a nonsmoker.  Past Medical History  Diagnosis Date  . Ulcer   . Deaf   . Menometrorrhagia 07/17/2011  . Fibroid     ROS as above otherwise neg. No Chest pain, palpitations, SOB, Fever, Chills, Abd pain, N/V/D.  Medications reviewed:  Not currently taking any medications   Exam:  BP 135/87  Pulse 91  Temp 98.6 F (37 C) (Oral)  Ht 5\' 6"  (1.676 m)  Wt 176 lb (79.833 kg)  BMI 28.41 kg/m2  LMP 07/29/2012 Gen:  Alert, cooperative patient who appears stated age in no acute distress.  Vital signs reviewed. HEENT:  [place]/AT.  EOMI, PERRL.  MMM, tonsils non-erythematous, non-edematous.  External ears WNL, Bilateral TM's normal without retraction, redness or bulging.  Neck:  Supple Cardiac:  Regular rate and rhythm without murmur auscultated.  Good S1/S2. Pulm:  Clear to auscultation bilaterally with good air movement.  No wheezes or rales noted.   Abd:  Soft/nondistended/nontender.  Good bowel sounds throughout all four quadrants.  No masses noted.  Ext:  No clubbing/cyanosis/erythema.  No edema noted  bilateral lower extremities.  Marland Kitchen Psych:  Very pleasant, conversant, not anxious or depressed appearing

## 2012-08-13 ENCOUNTER — Encounter: Payer: Self-pay | Admitting: Family Medicine

## 2012-08-13 ENCOUNTER — Ambulatory Visit (INDEPENDENT_AMBULATORY_CARE_PROVIDER_SITE_OTHER): Payer: Medicaid Other | Admitting: Family Medicine

## 2012-08-13 VITALS — BP 129/63 | HR 75 | Temp 97.7°F | Wt 178.5 lb

## 2012-08-13 DIAGNOSIS — L259 Unspecified contact dermatitis, unspecified cause: Secondary | ICD-10-CM

## 2012-08-13 DIAGNOSIS — L309 Dermatitis, unspecified: Secondary | ICD-10-CM

## 2012-08-13 MED ORDER — TRIAMCINOLONE ACETONIDE 0.1 % EX OINT: TOPICAL_OINTMENT | Freq: Two times a day (BID) | CUTANEOUS | Status: DC

## 2012-08-13 NOTE — Patient Instructions (Addendum)
Please pick up Triamcinolone ointment and use twice per day as needed for itching and rash. You can also purchase AVEENO lotion (daily moisturizer) and apply twice per day for dry skin. Return to clinic in 4 weeks to see if rash is improving or return to clinic sooner if rash or symptoms worsen.  Eczema Atopic dermatitis, or eczema, is an inherited type of sensitive skin. Often people with eczema have a family history of allergies, asthma, or hay fever. It causes a red itchy rash and dry scaly skin. The itchiness may occur before the skin rash and may be very intense. It is not contagious. Eczema is generally worse during the cooler winter months and often improves with the warmth of summer. Eczema usually starts showing signs in infancy. Some children outgrow eczema, but it may last through adulthood. Flare-ups may be caused by:  Eating something or contact with something you are sensitive or allergic to.  Stress. DIAGNOSIS  The diagnosis of eczema is usually based upon symptoms and medical history. TREATMENT  Eczema cannot be cured, but symptoms usually can be controlled with treatment or avoidance of allergens (things to which you are sensitive or allergic to).  Controlling the itching and scratching.  Use over-the-counter antihistamines as directed for itching. It is especially useful at night when the itching tends to be worse.  Use over-the-counter steroid creams as directed for itching.  Scratching makes the rash and itching worse and may cause impetigo (a skin infection) if fingernails are contaminated (dirty).  Keeping the skin well moisturized with creams every day. This will seal in moisture and help prevent dryness. Lotions containing alcohol and water can dry the skin and are not recommended.  Limiting exposure to allergens.  Recognizing situations that cause stress.  Developing a plan to manage stress. HOME CARE INSTRUCTIONS   Take prescription and over-the-counter  medicines as directed by your caregiver.  Do not use anything on the skin without checking with your caregiver.  Keep baths or showers short (5 minutes) in warm (not hot) water. Use mild cleansers for bathing. You may add non-perfumed bath oil to the bath water. It is best to avoid soap and bubble bath.  Immediately after a bath or shower, when the skin is still damp, apply a moisturizing ointment to the entire body. This ointment should be a petroleum ointment. This will seal in moisture and help prevent dryness. The thicker the ointment the better. These should be unscented.  Keep fingernails cut short and wash hands often. If your child has eczema, it may be necessary to put soft gloves or mittens on your child at night.  Dress in clothes made of cotton or cotton blends. Dress lightly, as heat increases itching.  Avoid foods that may cause flare-ups. Common foods include cow's milk, peanut butter, eggs and wheat.  Keep a child with eczema away from anyone with fever blisters. The virus that causes fever blisters (herpes simplex) can cause a serious skin infection in children with eczema. SEEK MEDICAL CARE IF:   Itching interferes with sleep.  The rash gets worse or is not better within one week following treatment.  The rash looks infected (pus or soft yellow scabs).  You or your child has an oral temperature above 102 F (38.9 C).  Your baby is older than 3 months with a rectal temperature of 100.5 F (38.1 C) or higher for more than 1 day.  The rash flares up after contact with someone who has fever blisters. SEEK  IMMEDIATE MEDICAL CARE IF:   Your baby is older than 3 months with a rectal temperature of 102 F (38.9 C) or higher.  Your baby is older than 3 months or younger with a rectal temperature of 100.4 F (38 C) or higher. Document Released: 06/16/2000 Document Revised: 09/11/2011 Document Reviewed: 04/21/2009 [facility] Patient Information 2013 [place], [place].

## 2012-08-13 NOTE — Assessment & Plan Note (Signed)
Patient says Desonide has not made a difference.  Will try Triamcinolone ointment.  If no improvement, consider anti-fungal treatment or skin biopsy.  Also recommended Aveeno lotion and strong advised patient to keep skin moisturized.  Handout given. Return to clinic for check up in 4 weeks or sooner if symptoms worsen.

## 2012-08-13 NOTE — Progress Notes (Signed)
  Subjective:    Patient ID: Ashley Erickson, female    DOB: May 05, 1976, 37 y.o.   MRN: [id number]  HPI   ASL interpreter present during encounter.  Patient presents to same day clinic for skin rash.  Patient says rash has been going on since 2011, but has worsened in the last week.  Describes rash as very itchy and dry.  Rash is not painful, but she does complain of some irritation when putting pants on.  Rash is worse after showering.  She had Desonide cream from the [facility] last year, but patient said it did not work.  Patient denies family hx of eczema.  She does not recall having this rash in [place] as a child.    Denies any associated fever, chills, nausea/vomiting.   Review of Systems  Per HPI    Objective:   Physical Exam  Skin:  LT leg (posterior): multiple, hypopigmented lesions posterior thigh extending to calf.  Not scaly, no excoriations.  No signs of infection. Generalized dry skin.       Assessment & Plan:

## 2012-11-07 ENCOUNTER — Emergency Department (HOSPITAL_COMMUNITY): Admission: EM | Admit: 2012-11-07 | Discharge: 2012-11-07 | Payer: Medicaid Other | Source: Home / Self Care

## 2013-04-15 ENCOUNTER — Ambulatory Visit
Admission: RE | Admit: 2013-04-15 | Discharge: 2013-04-15 | Disposition: A | Discharge status: Home / Self Care | Payer: Medicaid Other | Source: Ambulatory Visit | Attending: Family Medicine | Admitting: Family Medicine

## 2013-04-15 ENCOUNTER — Ambulatory Visit (INDEPENDENT_AMBULATORY_CARE_PROVIDER_SITE_OTHER): Payer: Medicaid Other | Admitting: Family Medicine

## 2013-04-15 ENCOUNTER — Encounter: Payer: Self-pay | Admitting: Family Medicine

## 2013-04-15 VITALS — BP 120/80 | HR 75 | Temp 98.2°F | Ht 66.0 in | Wt 176.0 lb

## 2013-04-15 DIAGNOSIS — M25572 Pain in left ankle and joints of left foot: Secondary | ICD-10-CM

## 2013-04-15 DIAGNOSIS — J309 Allergic rhinitis, unspecified: Secondary | ICD-10-CM

## 2013-04-15 DIAGNOSIS — Z23 Encounter for immunization: Secondary | ICD-10-CM

## 2013-04-15 DIAGNOSIS — M25579 Pain in unspecified ankle and joints of unspecified foot: Secondary | ICD-10-CM

## 2013-04-15 MED ORDER — TRAMADOL HCL 50 MG PO TABS: 50.0000 mg | ORAL_TABLET | Freq: Three times a day (TID) | ORAL | Status: DC | PRN

## 2013-04-15 MED ORDER — LORATADINE 10 MG PO TABS: 10.0000 mg | ORAL_TABLET | Freq: Every day | ORAL | Status: DC
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
## 2013-04-15 NOTE — Patient Instructions (Addendum)
Make sure you wear tennis shoes when working.  Use the ankle brace for support.   Try [facility] to see if you can pick this up there. If not, they will have the brace at [facility] on [address].  Take the Claritin daily for allergies.  Go for ankle xray.  Come back in 2 weeks and see how you're doing.  Ankle Instability with Rehab Surgical treatment for chronic ankle instability is typically only for individuals with recurrent ankle sprains or giving way due to looseness of the ankle ligaments. These patients have undergone at least 3 months of conservative treatment with no resolution of symptoms. The purpose of surgery for chronic ankle instability is to tighten the ligaments of the ankle or using other structures to replace and/or give additional support to the ligaments of the ankle. CONTRAINDICATIONS  Infection.  Inability or unwillingness to comply with a postoperative rehabilitation program. TECHNIQUE There are many different techniques for fixing an unstable ankle. One of the most common techniques requires the surgeon to cut loose ligament, and then reattach them closer together with sutures. This will stabilize the ankle better. Other surgeons may choose to replace (reconstruct) the damaged ligament(s). RELATED COMPLICATIONS   Infection.  Recurrence of giving way (instability).  Continued pain.  Stiffness of ankle.  Bleeding.  Stretching out of the repair.  Weakness of the muscles of the ankle.  Injury to nerves (numbness, weakness, paralysis) of the foot and ankle. HOME CARE INSTRUCTIONS  Management after surgery varies.  Keep the wound clean and dry for the first 10 to 14 days after surgery.  In order to reduce inflammation, keep the foot and ankle elevated above heart level as much as possible for the first 1 to 2 weeks after surgery.  Pain medications will be provided by your caregiver.  Casting is often used, varying from 4 to 8 weeks.  You may  not be allowed to bear weight on the foot for 2 to 6 weeks. This is followed by a short walking cast or brace for 3 to 6 weeks.  Postoperative rehabilitation and exercises are very important to regain motion and strength. RETURN TO SPORTS   The length of recovery before a return to sports depends on the type of sport and position played, as well as the quality of ligaments at the time of repair.  A minimum of 3 months is necessary after surgery before returning to sports.  Full ankle motion and strength are necessary before returning to sports. SEEK MEDICAL CARE IF:   You experience pain, numbness, or coldness in the foot and ankle.  Blue, gray, or dusky color appears in the toenails.  Any of the following occur after surgery:  Increased pain, swelling, redness, drainage, or bleeding in the surgical area.  Signs of infection (headache, muscle aches, dizziness, or a general ill feeling with fever.)  New, unexplained symptoms develop (drugs used in treatment may produce side effects.) EXERCISES  RANGE OF MOTION (ROM) AND STRETCHING EXERCISES - Surgery for Chronic Ankle Instability These exercises will help you rehabilitate your ankle the first 2-3 weeks after your cast is removed. Your physician, physical therapist or athletic trainer will progress your exercise once you have demonstrated the sufficient gains in flexibility and strength. While completing these exercises, remember:   Restoring tissue flexibility helps normal motion to return to the joints. This allows healthier, less painful movement and activity.  An effective stretch should be held for at least 30 seconds.  A stretch should  never be painful. You should only feel a gentle lengthening or release in the stretched tissue. RANGE OF MOTION - Dorsi/Plantar Flexion  While sitting with your right / left knee straight, draw the top of your foot upwards by flexing your ankle. Then reverse the motion, pointing your toes  downward.  Hold each position for __________ seconds.  After completing your first set of exercises, repeat this exercise with your knee bent. Repeat __________ times. Complete this exercise __________ times per day.  RANGE OF MOTION - Ankle Alphabet  Imagine your right / left big toe is a pen.  Keeping your hip and knee still, write out the entire alphabet with your "pen." Make the letters as large as you can without increasing any discomfort. Repeat __________ times. Complete this exercise __________ times per day.  STRETCH - Gastrocsoleus  Sit with your right / left leg extended. Holding onto both ends of a belt or towel, loop it around the ball of your foot.  Keeping your right / left ankle and foot relaxed and your knee straight, pull your foot and ankle toward you using the belt/towel.  You should feel a gentle stretch behind your calf or knee. Hold this position for __________ seconds. Repeat __________ times. Complete this stretch __________ times per day.  RANGE OF MOTION - Ankle Dorsiflexion, Active Assisted  Remove shoes and sit on a chair that is preferably not on a carpeted surface.  Place right / left foot under knee. Extend your opposite leg for support.  Keeping your heel down, slide your right / left foot back toward the chair until you feel a stretch at your ankle or calf. If you do not feel a stretch, slide your bottom forward to the edge of the chair, while still keeping your heel down. Hold this stretch for __________ seconds.  Repeat __________ times. Complete this stretch __________ times per day.  STRENGTHENING EXERCISES - Surgery for Chronic Ankle Instability These exercises will help you begin to restore your ankle flexibility the first 2-3 weeks after your cast is removed. Your physician, physical therapist or athletic trainer will progress your exercise once you have demonstrated the sufficient gains in flexibility and strength While completing these  exercises, remember:   Muscles can gain both the endurance and the strength needed for everyday activities through controlled exercises.  Complete these exercises as instructed by your physician, physical therapist or athletic trainer. Progress the resistance and repetitions only as guided.  You may experience muscle soreness or fatigue, but the pain or discomfort you are trying to eliminate should never worsen during these exercises. If this pain does worsen, stop and make certain you are following the directions exactly. If the pain is still present after adjustments, discontinue the exercise until you can discuss the trouble with your clinician. STRENGTH Dorsiflexors  Secure a rubber exercise band/tubing to a fixed object (table, pole) and loop the other end around your right / left foot.  Sit on the floor facing the fixed object. The band/tubing should be slightly tense when your foot is relaxed.  Slowly draw your foot back toward you using your ankle and toes.  Hold this position for __________ seconds. Slowly release the tension in the band and return your foot to the starting position. Repeat __________ times. Complete this exercise __________ times per day.  STRENGTH - Plantar-flexors  Sit with your right / left leg extended. Holding onto both ends of a rubber exercise band/tubing, loop it around the ball of your  foot. Keep a slight tension in the band.  Slowly push your toes away from you, pointing them downward.  Hold this position for __________ seconds. Return slowly, controlling the tension in the band/tubing. Repeat __________ times. Complete this exercise __________ times per day.  STRENGTH - Ankle Eversion  Secure one end of a rubber exercise band/tubing to a fixed object (table, pole). Loop the other end around your foot just before your toes.  Place your fists between your knees. This will focus your strengthening at your ankle.  Drawing the band/tubing across your  opposite foot, slowly, pull your little toe out and up. Make sure the band/tubing is positioned to resist the entire motion.  Hold this position for __________ seconds.  Have your muscles resist the band/tubing as it slowly pulls your foot back to the starting position. Repeat __________ times. Complete this exercise __________ times per day.  STRENGTH - Ankle Inversion   Secure one end of a rubber exercise band/tubing to a fixed object (table, pole). Loop the other end around your foot just before your toes.  Place your fists between your knees. This will focus your strengthening at your ankle.  Slowly, pull your big toe up and in, making sure the band/tubing is positioned to resist the entire motion.  Hold this position for __________ seconds.  Have your muscles resist the band/tubing as it slowly pulls your foot back to the starting position. Repeat __________ times. Complete this exercises __________ times per day.  STRENGTH - Towel Curls  Sit in a chair positioned on a non-carpeted surface.  Place your foot on a towel, keeping your heel on the floor.  Pull the towel toward your heel by only curling your toes. Keep your heel on the floor.  If instructed by your physician, physical therapist or athletic trainer, add weight to the end of the towel. Repeat __________ times. Complete this exercise __________ times per day. Document Released: 06/19/2005 Document Revised: 09/11/2011 Document Reviewed: 10/01/2008 [facility] Patient Information 2014 [place], [place].

## 2013-04-16 ENCOUNTER — Telehealth: Payer: Self-pay | Admitting: Family Medicine

## 2013-04-16 DIAGNOSIS — J309 Allergic rhinitis, unspecified: Secondary | ICD-10-CM | POA: Insufficient documentation

## 2013-04-16 DIAGNOSIS — M25572 Pain in left ankle and joints of left foot: Secondary | ICD-10-CM | POA: Insufficient documentation

## 2013-04-16 NOTE — Telephone Encounter (Signed)
LVM through sign language company of below message

## 2013-04-16 NOTE — Telephone Encounter (Signed)
Can you guys call Ms Hogge and let her know that her ankle xray is normal?  She continue to wear her brace and practice her ankle exercises and see me in 2-3 weeks.  Thanks!  Trey Paula

## 2013-04-16 NOTE — Assessment & Plan Note (Signed)
Chronic issue for her, although she has not brought this up before. No evidence of bony tenderness. We will send her for an x-ray though this is likely going to be low yield. She does not have any joint laxity. Likely ankle strain. Prescription for ASO brace written today. I have also given her at exercises and ankle strengthening exercises which she shouldn't attempt the next 2 weeks. She will followup with me in 2 weeks and we will readdress symptoms of that time. She cannot take Ibuprofen and therefore will provide Tramadol for pain relief.

## 2013-04-16 NOTE — Assessment & Plan Note (Signed)
New issue for her. We will start with oral antihistamines. She may require nasal steroids pending resolution

## 2013-05-06 ENCOUNTER — Ambulatory Visit: Payer: Medicaid Other | Admitting: Family Medicine

## 2013-05-15 ENCOUNTER — Ambulatory Visit: Payer: Medicaid Other | Admitting: Family Medicine

## 2013-05-20 ENCOUNTER — Ambulatory Visit: Payer: Medicaid Other | Admitting: Family Medicine

## 2013-07-01 ENCOUNTER — Encounter (HOSPITAL_COMMUNITY): Payer: Self-pay | Admitting: Emergency Medicine

## 2013-07-01 ENCOUNTER — Emergency Department (HOSPITAL_COMMUNITY)
Admission: EM | Admit: 2013-07-01 | Discharge: 2013-07-01 | Disposition: A | Discharge status: Home / Self Care | Payer: Medicaid Other | Source: Home / Self Care | Attending: Family Medicine | Admitting: Family Medicine

## 2013-07-01 DIAGNOSIS — J069 Acute upper respiratory infection, unspecified: Secondary | ICD-10-CM

## 2013-07-01 MED ORDER — HYDROCODONE-ACETAMINOPHEN 5-325 MG PO TABS: 0.5000 | ORAL_TABLET | Freq: Every evening | ORAL | Status: DC | PRN

## 2013-07-01 MED ORDER — IPRATROPIUM BROMIDE 0.06 % NA SOLN: 2.0000 | Freq: Four times a day (QID) | NASAL | Status: DC

## 2013-07-01 NOTE — ED Provider Notes (Signed)
Ashley Erickson is a 37 y.o. female who presents to Urgent Care today for one day of sneezing cough nasal congestion. No fevers chills nausea vomiting or diarrhea. She she has tried over-the-counter combination cough and cold medications which did not help much. Her children are sick with a similar illness. She feels well otherwise   Past Medical History  Diagnosis Date  . Deaf   . Menometrorrhagia 07/17/2011  . Fibroid   . Ulcer "years ago"    Diet controlled, has not needed any medications for this    History  Substance Use Topics  . Smoking status: Never Smoker   . Smokeless tobacco: Not on file  . Alcohol Use: 0.0 oz/week    0 Glasses of wine per week   ROS as above Medications reviewed. No current facility-administered medications for this encounter.   Current Outpatient Prescriptions  Medication Sig Dispense Refill  . desonide (DESOWEN) 0.05 % cream Apply topically 2 (two) times daily.  30 g  0  . ferrous sulfate 325 (65 FE) MG tablet Take 1 tablet (325 mg total) by mouth daily with breakfast.  30 tablet  3  . HYDROcodone-acetaminophen (NORCO/VICODIN) 5-325 MG per tablet Take 0.5 tablets by mouth at bedtime as needed (cough).  6 tablet  0  . ipratropium (ATROVENT) 0.06 % nasal spray Place 2 sprays into both nostrils 4 (four) times daily.  15 mL  1  . loratadine (CLARITIN) 10 MG tablet Take 1 tablet (10 mg total) by mouth daily.  30 tablet  3  . triamcinolone ointment (KENALOG) 0.1 % Apply topically 2 (two) times daily.  30 g  0    Exam:  BP 123/79  Pulse 90  Temp(Src) 98.5 F (36.9 C) (Oral)  Resp 16  SpO2 100% Gen: Well NAD HEENT: EOMI,  MMM, tympanic membranes are normal appearing bilaterally. Posterior pharynx cobblestoning. Lungs: Normal work of breathing. CTABL Heart: RRR no MRG Abd: NABS, Soft. NT, ND Exts: Non edematous BL  LE, warm and well perfused.    Assessment and Plan: 37 y.o. female with viral URI with cough. Plan to treat symptomatically with  hydrocodone does cough medication, Atrovent nasal spray, and over-the-counter Tylenol or ibuprofen. Recommend patient followup with her primary care provider. Discussed warning signs or symptoms. Please see discharge instructions. Patient expresses understanding.      Rodolph Bong, MD 07/01/13 [phone]

## 2013-07-07 ENCOUNTER — Encounter (HOSPITAL_COMMUNITY): Payer: Self-pay | Admitting: Emergency Medicine

## 2013-07-07 ENCOUNTER — Emergency Department (INDEPENDENT_AMBULATORY_CARE_PROVIDER_SITE_OTHER)
Admission: EM | Admit: 2013-07-07 | Discharge: 2013-07-07 | Disposition: A | Discharge status: Home / Self Care | Payer: Medicaid Other | Source: Home / Self Care | Attending: Emergency Medicine | Admitting: Emergency Medicine

## 2013-07-07 DIAGNOSIS — B970 Adenovirus as the cause of diseases classified elsewhere: Secondary | ICD-10-CM

## 2013-07-07 DIAGNOSIS — J019 Acute sinusitis, unspecified: Secondary | ICD-10-CM

## 2013-07-07 DIAGNOSIS — B340 Adenovirus infection, unspecified: Secondary | ICD-10-CM

## 2013-07-07 DIAGNOSIS — H109 Unspecified conjunctivitis: Secondary | ICD-10-CM

## 2013-07-07 MED ORDER — AMOXICILLIN-POT CLAVULANATE 875-125 MG PO TABS: 1.0000 | ORAL_TABLET | Freq: Two times a day (BID) | ORAL | Status: DC

## 2013-07-07 MED ORDER — POLYMYXIN B-TRIMETHOPRIM 10000-0.1 UNIT/ML-% OP SOLN: 1.0000 [drp] | OPHTHALMIC | Status: DC

## 2013-07-07 MED ORDER — TRAMADOL HCL 50 MG PO TABS: ORAL_TABLET | ORAL | Status: DC

## 2013-07-07 NOTE — ED Provider Notes (Signed)
Chief Complaint   Chief Complaint  Patient presents with  . Cough    History of Present Illness   Ashley Erickson is a 38 year old hearing impaired female who comes in today with her son and daughter. The son is fluent in sign language and serves as a sign language interpreter for her. She has one week history of bilateral redness of the eyes, pain and itching of the eyes and yellow drainage. She's had nasal congestion, rhinorrhea, sneezing, cough productive yellow-green sputum, sore throat, and neck pain. She feels weak all over and achy and had subjective fever. She was here on December 30 for the same symptoms. She was prescribed Atrovent nasal spray, but feels no better.  Review of Systems   Other than as noted above, the patient denies any of the following symptoms: Systemic:  No fevers, chills, sweats, or myalgias. Eye:  No redness or discharge. ENT:  No ear pain, headache, nasal congestion, drainage, sinus pressure, or sore throat. Neck:  No neck pain, stiffness, or swollen glands. Lungs:  No cough, sputum production, hemoptysis, wheezing, chest tightness, shortness of breath or chest pain. GI:  No abdominal pain, nausea, vomiting or diarrhea.  [place]   Past medical history, family history, social history, meds, and allergies were reviewed.   Physical exam   Vital signs:  BP 144/82  Pulse 88  Temp(Src) 98 F (36.7 C) (Oral)  Resp 18  SpO2 98% General:  Alert and oriented.  In no distress.  Skin warm and dry. Eye:  She has bilateral conjunctival injection and watering of the eyes, there is no purulent drainage, corneas are intact an anterior chambers are normal, PERRLA, full EOMs. Lids were normal. ENT:  TMs and canals were normal, without erythema or inflammation.  Nasal mucosa was clear and uncongested, without drainage.  Mucous membranes were moist.  Pharynx was erythematous with no exudate or drainage.  There were no oral ulcerations or lesions. Neck:  Supple, no  adenopathy, tenderness or mass. Lungs:  No respiratory distress.  Lungs were clear to auscultation, without wheezes, rales or rhonchi.  Breath sounds were clear and equal bilaterally.  Heart:  Regular rhythm, without gallops, murmers or rubs. Skin:  Clear, warm, and dry, without rash or lesions.  Assessment     The primary encounter diagnosis was Adenovirus infection. Diagnoses of Conjunctivitis and Acute sinusitis were also pertinent to this visit.  Since this has been going on for over a week now, I think it's time to try some antibiotics. She was given Augmentin, tramadol for the cough, and Polytrim eyedrops.  Plan    1.  Meds:  The following meds were prescribed:   Discharge Medication List as of 07/07/2013 11:41 AM    START taking these medications   Details  amoxicillin-clavulanate (AUGMENTIN) 875-125 MG per tablet Take 1 tablet by mouth 2 (two) times daily., Starting 07/07/2013, Until Discontinued, Normal    traMADol (ULTRAM) 50 MG tablet 1 to 2 tabs every 8 hours as needed for cough., Normal    trimethoprim-polymyxin b (POLYTRIM) ophthalmic solution Place 1 drop into both eyes every 4 (four) hours., Starting 07/07/2013, Until Discontinued, Normal        2.  Patient Education/Counseling:  The patient was given appropriate handouts, self care instructions, and instructed in symptomatic relief.  Instructed to get extra fluids, rest, and use a cool mist vaporizer.   3.  Follow up:  The patient was told to follow up here if no better in 3 to 4  days, or sooner if becoming worse in any way, and given some red flag symptoms such as increasing fever, difficulty breathing, chest pain, or persistent vomiting which would prompt immediate return.  Follow up here as needed.      Harden Mo, MD 07/07/13 [phone]

## 2013-07-07 NOTE — ED Notes (Signed)
Continued URI type syx from 12-30 visit; information from son, who is  sign language interpreter

## 2013-07-07 NOTE — Discharge Instructions (Signed)
For the nasal congestion, take Zyrtec-D 12 hour twice daily.  You can get this over the counter.

## 2013-08-14 ENCOUNTER — Ambulatory Visit (INDEPENDENT_AMBULATORY_CARE_PROVIDER_SITE_OTHER): Payer: Medicaid Other | Admitting: Family Medicine

## 2013-08-14 ENCOUNTER — Encounter: Payer: Self-pay | Admitting: Family Medicine

## 2013-08-14 VITALS — BP 145/70 | HR 88 | Temp 98.7°F | Wt 178.1 lb

## 2013-08-14 DIAGNOSIS — R21 Rash and other nonspecific skin eruption: Secondary | ICD-10-CM

## 2013-08-14 LAB — POCT SKIN KOH: SKIN KOH, POC: NEGATIVE

## 2013-08-14 NOTE — Patient Instructions (Signed)
It was good to see you today  I will call you with the results on Monday.    Keep a bandage on the area until it stops bleeding.

## 2013-08-15 DIAGNOSIS — R21 Rash and other nonspecific skin eruption: Secondary | ICD-10-CM | POA: Insufficient documentation

## 2013-08-15 NOTE — Progress Notes (Addendum)
Subjective:    Ashley Erickson is a 38 y.o. female who presents to [facility] today for check of her leg.  Patient is deaf and presents with sign language interpreter:  1.  Leg rash:  Still present.  Present since she before she left [place] in 2011.  Red streaks that have extended below knee to lateral aspect of calf.  Burns at times.  Was using Triamcinolone provided by another physician but this hasn't helped at all.    No fevers or chills.  Has not tried anything else for relief other than OTC lotions.     ROS as above per HPI, otherwise neg.    The following portions of the patient's history were reviewed and updated as appropriate: allergies, current medications, past medical history, family and social history, and problem list. Patient is a nonsmoker.    PMH reviewed.  Past Medical History  Diagnosis Date  . Deaf   . Menometrorrhagia 07/17/2011  . Fibroid   . Ulcer "years ago"    Diet controlled, has not needed any medications for this    Past Surgical History  Procedure Laterality Date  . Appendectomy  1994  . Cesarean section  2008  . Myomectomy abdominal approach  2004    Medications reviewed. Current Outpatient Prescriptions  Medication Sig Dispense Refill  . amoxicillin-clavulanate (AUGMENTIN) 875-125 MG per tablet Take 1 tablet by mouth 2 (two) times daily.  20 tablet  0  . desonide (DESOWEN) 0.05 % cream Apply topically 2 (two) times daily.  30 g  0  . ferrous sulfate 325 (65 FE) MG tablet Take 1 tablet (325 mg total) by mouth daily with breakfast.  30 tablet  3  . HYDROcodone-acetaminophen (NORCO/VICODIN) 5-325 MG per tablet Take 0.5 tablets by mouth at bedtime as needed (cough).  6 tablet  0  . ipratropium (ATROVENT) 0.06 % nasal spray Place 2 sprays into both nostrils 4 (four) times daily.  15 mL  1  . loratadine (CLARITIN) 10 MG tablet Take 1 tablet (10 mg total) by mouth daily.  30 tablet  3  . traMADol (ULTRAM) 50 MG tablet 1 to 2 tabs every 8 hours as needed for  cough.  30 tablet  0  . triamcinolone ointment (KENALOG) 0.1 % Apply topically 2 (two) times daily.  30 g  0  . trimethoprim-polymyxin b (POLYTRIM) ophthalmic solution Place 1 drop into both eyes every 4 (four) hours.  10 mL  0   No current facility-administered medications for this visit.     Objective:   Physical Exam BP 145/70  Pulse 88  Temp(Src) 98.7 F (37.1 C) (Oral)  Wt 178 lb 1.6 oz (80.786 kg) Gen:  Alert, cooperative patient who appears stated age in no acute distress.  Vital signs reviewed. Skin:  Erythematous macular linear streaks lateral aspect of Right thigh and left calf.  Patches, extend over about 5 cm thigh, only 1 cm calf.  No other lesions noted.  Biopsy performed.  After informed written consent was obtained, using Betadine for cleansing and 1% Lidocaine with epinephrine for anesthetic, with sterile technique a 4 mm punch biopsy was used to obtain a biopsy specimen of one of the lesions at the margin on Right leg. Hemostasis was obtained by pressure and wound was not needed to be sutured. Antibiotic dressing is applied, and wound care instructions provided to be alert for any signs of cutaneous infection. The specimen is labeled and sent to pathology for evaluation. The procedure was well tolerated  without complications.  Nurse chaperone present for entire procedure.    Skin scraping obtained  Results for orders placed in visit on 08/14/13 (from the past 72 hour(s))  POCT SKIN KOH     Status: None   Collection Time    08/14/13  4:34 PM      Result Value Ref Range   Skin KOH, POC Negative
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
## 2013-08-22 ENCOUNTER — Other Ambulatory Visit: Payer: Self-pay | Admitting: *Deleted

## 2013-08-22 ENCOUNTER — Telehealth: Payer: Self-pay | Admitting: *Deleted

## 2013-08-22 DIAGNOSIS — R21 Rash and other nonspecific skin eruption: Secondary | ICD-10-CM

## 2013-08-22 MED ORDER — TRAMADOL HCL 50 MG PO TABS: ORAL_TABLET | ORAL | Status: DC

## 2013-08-22 NOTE — Telephone Encounter (Signed)
Patient calls to requesting lab results and some pain medicine for the painful rash.After discussing this with Dr Silver Huguenin Dr Mingo Amber patient was informed that labs were fine ,prescription for Tramadol was called in and to expect a phone call from a Dermatology.Dr Mingo Amber would be putting in referral.translated relayed message.patient seem to understand.Again per Dr Mingo Amber Rx for [facility] was called in. Israel Werts, Lewie Loron

## 2013-08-27 ENCOUNTER — Telehealth: Payer: Self-pay | Admitting: Family Medicine

## 2013-08-27 NOTE — Telephone Encounter (Signed)
Please advise. Ashley Erickson S  

## 2013-08-27 NOTE — Telephone Encounter (Signed)
She really needs to speak with dr directly, Never received an results and wants to know what is wrong with her.

## 2013-08-27 NOTE — Telephone Encounter (Signed)
Discussed biopsy results with patient.  I'm unsure exactly what's going on with the rash on her leg.  Explained that we have already referred her to Dermatology.  Should hear something by next week.  Patient expressed appreciation for call and referral.

## 2013-09-24 ENCOUNTER — Telehealth: Payer: Self-pay | Admitting: Family Medicine

## 2013-09-24 NOTE — Telephone Encounter (Signed)
Pt wanted to talk to dr Mingo Amber. About 2 months ago, testing was done on her leg. It was analyzed in the lab. I never heard anything about the test results

## 2013-09-26 NOTE — Telephone Encounter (Signed)
I spoke with the patient through her sign language intepreter on 2/25 and gave her the biopsy results.  I also told her that we would refer her to Dermatolgy.  It sounds like she hasn't yet been seen there?  Would you guys mind calling and asking the patient about dermatology?

## 2013-09-29 NOTE — Telephone Encounter (Signed)
Patient hasn't received a Dermatology.Patient complains of a boil on the outer part of the buttocks.She states this comes and goes.After talking with Dr Mingo Amber patient was advised to schedule a follow up with him.patient voiced understanding.Ortencia Askari, Lewie Loron

## 2013-10-07 ENCOUNTER — Ambulatory Visit (INDEPENDENT_AMBULATORY_CARE_PROVIDER_SITE_OTHER): Payer: Medicaid Other | Admitting: Family Medicine

## 2013-10-07 ENCOUNTER — Encounter: Payer: Self-pay | Admitting: Family Medicine

## 2013-10-07 VITALS — BP 134/79 | HR 84 | Temp 98.4°F | Ht 66.0 in | Wt 180.0 lb

## 2013-10-07 DIAGNOSIS — L0591 Pilonidal cyst without abscess: Secondary | ICD-10-CM

## 2013-10-07 DIAGNOSIS — R21 Rash and other nonspecific skin eruption: Secondary | ICD-10-CM

## 2013-10-07 NOTE — Patient Instructions (Signed)
You have a pilonidal cyst on your buttocks.  This can either be painful or cause itching.  It is a fluid-filled sac that will come and go. It may eventually become infected and need to be drained.    When it returns, you should try to use the antibiotic ointment Bactroban over the counter to help.  The ultimate treatment is surgery.  Use the Clotrimazole over the counter on your leg.  We will treat this like a fungal infection.  I will call the dermatologist.    It was good to see you today  Pilonidal Cyst A pilonidal cyst occurs when hairs get trapped (ingrown) beneath the skin in the crease between the buttocks over your sacrum (the bone under that crease). Pilonidal cysts are most common in young men with a lot of body hair. When the cyst is ruptured (breaks) or leaking, fluid from the cyst may cause burning and itching. If the cyst becomes infected, it causes a painful swelling filled with pus (abscess). The pus and trapped hairs may need to be removed (often by lancing) so that the infection can heal. However, recurrence is common and an operation may be needed to remove the cyst. HOME CARE INSTRUCTIONS   If the cyst was NOT INFECTED:  Keep the area clean and dry. Bathe or shower daily. Wash the area well with a germ-killing soap. Warm tub baths may help prevent infection and help with drainage. Dry the area well with a towel.  Avoid tight clothing to keep area as moisture free as possible.  Keep area between buttocks as free of hair as possible. A depilatory may be used.  If the cyst WAS INFECTED and needed to be drained:  Your caregiver packed the wound with gauze to keep the wound open. This allows the wound to heal from the inside outwards and continue draining.  Return for a wound check in 1 day or as suggested.  If you take tub baths or showers, repack the wound with gauze following them. Sponge baths (at the sink) are a good alternative.  If an antibiotic was ordered to  fight the infection, take as directed.  Only take over-the-counter or prescription medicines for pain, discomfort, or fever as directed by your caregiver.  After the drain is removed, use sitz baths for 20 minutes 4 times per day. Clean the wound gently with mild unscented soap, pat dry, and then apply a dry dressing. SEEK MEDICAL CARE IF:   You have increased pain, swelling, redness, drainage, or bleeding from the area.  You have a fever.  You have muscles aches, dizziness, or a general ill feeling. Document Released: 06/16/2000 Document Revised: 09/11/2011 Document Reviewed: 08/14/2008 [facility] Patient Information 2014 [place].

## 2013-10-07 NOTE — Progress Notes (Signed)
Subjective:    Ashley Erickson is a 38 y.o. female who presents to [facility] today for 2 issues:  1.  Leg: Still with pain and redness on area of rash.  Still has not heard from dermatology.  Has tried steroid creams but these cause intense burning.  Using lotions/moisturizers without relief.    2. Boil on buttock:  States these come and go.  States right above buttocks in midline.  Intermittent, always in same place.  Lasts for several days and then resolves. Itches, no real pain.  Has tried soap and water without relief.    No fevers or chills.    Prev health:  Currently overdue for tetanus.  ROS as above per HPI, otherwise neg.   The following portions of the patient's history were reviewed and updated as appropriate: allergies, current medications, past medical history, family and social history, and problem list. Patient is a nonsmoker.    PMH reviewed.  Past Medical History  Diagnosis Date  . Deaf   . Menometrorrhagia 07/17/2011  . Fibroid   . Ulcer "years ago"    Diet controlled, has not needed any medications for this    Past Surgical History  Procedure Laterality Date  . Appendectomy  1994  . Cesarean section  2008  . Myomectomy abdominal approach  2004    Medications reviewed. Current Outpatient Prescriptions  Medication Sig Dispense Refill  . amoxicillin-clavulanate (AUGMENTIN) 875-125 MG per tablet Take 1 tablet by mouth 2 (two) times daily.  20 tablet  0  . desonide (DESOWEN) 0.05 % cream Apply topically 2 (two) times daily.  30 g  0  . ferrous sulfate 325 (65 FE) MG tablet Take 1 tablet (325 mg total) by mouth daily with breakfast.  30 tablet  3  . HYDROcodone-acetaminophen (NORCO/VICODIN) 5-325 MG per tablet Take 0.5 tablets by mouth at bedtime as needed (cough).  6 tablet  0  . ipratropium (ATROVENT) 0.06 % nasal spray Place 2 sprays into both nostrils 4 (four) times daily.  15 mL  1  . loratadine (CLARITIN) 10 MG tablet Take 1 tablet (10 mg total) by mouth daily.  30  tablet  3  . traMADol (ULTRAM) 50 MG tablet 1 to 2 tabs every 8 hours as needed for pain.  30 tablet  0  . triamcinolone ointment (KENALOG) 0.1 % Apply topically 2 (two) times daily.  30 g  0  . trimethoprim-polymyxin b (POLYTRIM) ophthalmic solution Place 1 drop into both eyes every 4 (four) hours.  10 mL  0   No current facility-administered medications for this visit.     Objective:   Physical Exam BP 134/79  Pulse 84  Temp(Src) 98.4 F (36.9 C) (Oral)  Ht 5\' 6"  (1.676 m)  Wt 180 lb (81.647 kg)  BMI 29.07 kg/m2 Gen:  Alert, cooperative patient who appears stated age in no acute distress.  Vital signs reviewed. HEENT: EOMI,  MMM Skin:  Still with erythematous area posterior aspect of Left leg, extending to popliteal area.  Shrinking in size from last visit Buttocks:  Prior pilondal cyst which has currently resolved.  I can see where this has been in past.  Patient points directly to superior aspect of gluteal cleft  Examined with nurse chaperone in room for entire exam.     No results found for this or any previous visit (from the past 72 hour(s)).

## 2013-10-07 NOTE — Assessment & Plan Note (Signed)
Previously negative skin scraping/KOH. Treat with Clotrimazole as pain and inability to tolerate steroid cream -- no improvement either Call Dermatology to inquire about referral.

## 2013-10-07 NOTE — Assessment & Plan Note (Signed)
Desires definitive treatment.   Refer to surgery. See instructions.

## 2013-10-13 ENCOUNTER — Ambulatory Visit (INDEPENDENT_AMBULATORY_CARE_PROVIDER_SITE_OTHER): Payer: Medicaid Other | Admitting: Obstetrics & Gynecology

## 2013-10-13 ENCOUNTER — Encounter: Payer: Self-pay | Admitting: Obstetrics & Gynecology

## 2013-10-13 DIAGNOSIS — H9190 Unspecified hearing loss, unspecified ear: Secondary | ICD-10-CM

## 2013-10-13 DIAGNOSIS — N906 Unspecified hypertrophy of vulva: Secondary | ICD-10-CM

## 2013-10-13 NOTE — Progress Notes (Signed)
   CLINIC ENCOUNTER NOTE  History:  38 y.o. [id number] here today for evaluation of abnormal appearing labium.  She is hearing imparied, sign language interpreter present.  Patient reports having mild vulvar discomfort and noticed that her right labium is larger than the left.  She wants to make sure that "everything is okay". No other symptoms.  The following portions of the patient's history were reviewed and updated as appropriate: allergies, current medications, past family history, past medical history, past social history, past surgical history and problem list.  Normal pap in 08/2011.  Review of Systems:  A comprehensive review of systems was negative.  Objective:  Physical Exam There were no vitals taken for this visit. Gen: NAD Abd: Soft, nontender and nondistended Pelvic: Normal appearing external genitalia; right labium minus appears slightly larger than the left. No abnormal lesions or pigmentation noted, nontender.   Assessment & Plan:  Normal pelvic exam, R labium minus larger than L.  This assymmetry/hypertrophy is within normal limits, patient was reassured.  She was told that no intervention was needed unless she becomes very symptomatic. Patient agrees with this plan.  Routine preventative health maintenance measures emphasized.    Verita Schneiders, MD, [place] Attending [place], [place]

## 2013-10-13 NOTE — Patient Instructions (Signed)
Return to clinic for any scheduled appointments or for any gynecologic concerns as needed.   

## 2013-10-14 ENCOUNTER — Telehealth (INDEPENDENT_AMBULATORY_CARE_PROVIDER_SITE_OTHER): Payer: Self-pay | Admitting: Surgery

## 2013-10-14 ENCOUNTER — Encounter (INDEPENDENT_AMBULATORY_CARE_PROVIDER_SITE_OTHER): Payer: Self-pay | Admitting: General Surgery

## 2013-10-14 ENCOUNTER — Encounter (INDEPENDENT_AMBULATORY_CARE_PROVIDER_SITE_OTHER): Payer: Self-pay | Admitting: Surgery

## 2013-10-14 ENCOUNTER — Ambulatory Visit (INDEPENDENT_AMBULATORY_CARE_PROVIDER_SITE_OTHER): Payer: Medicaid Other | Admitting: Surgery

## 2013-10-14 VITALS — BP 116/78 | HR 78 | Temp 97.4°F | Resp 14 | Ht 65.0 in | Wt 181.2 lb

## 2013-10-14 DIAGNOSIS — L729 Follicular cyst of the skin and subcutaneous tissue, unspecified: Secondary | ICD-10-CM

## 2013-10-14 DIAGNOSIS — L723 Sebaceous cyst: Secondary | ICD-10-CM

## 2013-10-14 NOTE — Telephone Encounter (Signed)
Please call pt she needs a work note

## 2013-10-14 NOTE — Progress Notes (Signed)
Patient ID: Ashley Erickson, female   DOB: Jun 23, 1976, 38 y.o.   MRN: [id number]  Chief Complaint  Patient presents with  . New Evaluation    Pilonidal cyst    HPI Ashley Erickson is a 38 y.o. female.   HPI She is referred by Dr. Annabell Sabal for evaluation of a chronic buttock cyst. The patient reports it has been present for many years. It intermittently drains and then resolves. She is from [place]. She will have pain at the area of drainage. The fluid will be sometimes clear and sometimes purulent. She denies fever. She is otherwise without complaints. She has hearing loss and was accompanied by an interpreter Past Medical History  Diagnosis Date  . Deaf   . Menometrorrhagia 07/17/2011  . Fibroid   . Ulcer "years ago"    Diet controlled, has not needed any medications for this     Past Surgical History  Procedure Laterality Date  . Appendectomy  1994  . Cesarean section  2008  . Myomectomy abdominal approach  2004    Family History  Problem Relation Age of Onset  . Seizures Sister   . Anesthesia problems Neg Hx     Social History History  Substance Use Topics  . Smoking status: Never Smoker   . Smokeless tobacco: Not on file  . Alcohol Use: 0.0 oz/week    0 Glasses of wine per week    Allergies  Allergen Reactions  . Ibuprofen Other (See Comments)    Patient states intolerance to it-- had GI disturbances    Current Outpatient Prescriptions  Medication Sig Dispense Refill  . desonide (DESOWEN) 0.05 % cream Apply topically 2 (two) times daily.  30 g  0  . ferrous sulfate 325 (65 FE) MG tablet Take 1 tablet (325 mg total) by mouth daily with breakfast.  30 tablet  3  . triamcinolone ointment (KENALOG) 0.1 % Apply topically 2 (two) times daily.  30 g  0  . trimethoprim-polymyxin b (POLYTRIM) ophthalmic solution Place 1 drop into both eyes every 4 (four) hours.  10 mL  0  . ipratropium (ATROVENT) 0.06 % nasal spray Place 2 sprays into both nostrils 4 (four) times daily.   15 mL  1   No current facility-administered medications for this visit.    Review of Systems Review of Systems  Constitutional: Negative for fever, chills and unexpected weight change.  HENT: Positive for hearing loss. Negative for congestion, sore throat, trouble swallowing and voice change.   Eyes: Negative for visual disturbance.  Respiratory: Negative for cough and wheezing.   Cardiovascular: Negative for chest pain, palpitations and leg swelling.  Gastrointestinal: Negative for nausea, vomiting, abdominal pain, diarrhea, constipation, blood in stool, abdominal distention and anal bleeding.  Genitourinary: Negative for hematuria, vaginal bleeding and difficulty urinating.  Musculoskeletal: Negative for arthralgias.  Skin: Positive for wound. Negative for rash.  Neurological: Negative for seizures, syncope and headaches.  Hematological: Negative for adenopathy. Does not bruise/bleed easily.  Psychiatric/Behavioral: Negative for confusion.    Blood pressure 116/78, pulse 78, temperature 97.4 F (36.3 C), temperature source Temporal, resp. rate 14, height 5\' 5"  (1.651 m), weight 181 lb 3.2 oz (82.192 kg).  Physical Exam Physical Exam  Constitutional: She is oriented to person, place, and time. She appears well-developed and well-nourished. No distress.  HENT:  Head: Normocephalic and atraumatic.  Right Ear: External ear normal.  Left Ear: External ear normal.  Nose: Nose normal.  Mouth/Throat: No oropharyngeal exudate.  Eyes: Conjunctivae are  normal. Pupils are equal, round, and reactive to light. Right eye exhibits no discharge. Left eye exhibits no discharge. No scleral icterus.  Neck: Normal range of motion. Neck supple. No tracheal deviation present.  Cardiovascular: Normal rate, regular rhythm, normal heart sounds and intact distal pulses.   No murmur heard. Pulmonary/Chest: Effort normal and breath sounds normal. No respiratory distress. She has no wheezes. She has no  rales.  Abdominal: Soft. Bowel sounds are normal. She exhibits no distension. There is no tenderness. There is no rebound.  Musculoskeletal: Normal range of motion. She exhibits no edema and no tenderness.  Lymphadenopathy:    She has no cervical adenopathy.  Neurological: She is alert and oriented to person, place, and time.  Skin: Skin is warm and dry. She is not diaphoretic. No erythema.  On the left and near the gluteal cleft there is a 1 cm cyst underneath the skin with overlying scaliness of the skin. There is no erythema. 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 Nonetheless, because of recurrent infections, removal of this area is recommended for histologic evaluation. I discussed risks of surgery with her. These risks included but is not limited to bleeding, infection, recurrence, etc. I also discussed postoperative recovery. She understands and wishes to proceed.        Harl Bowie 10/14/2013, 2:28 PM

## 2013-10-16 ENCOUNTER — Other Ambulatory Visit (INDEPENDENT_AMBULATORY_CARE_PROVIDER_SITE_OTHER): Payer: Self-pay | Admitting: Surgery

## 2013-10-16 DIAGNOSIS — L723 Sebaceous cyst: Secondary | ICD-10-CM

## 2013-10-20 ENCOUNTER — Other Ambulatory Visit (INDEPENDENT_AMBULATORY_CARE_PROVIDER_SITE_OTHER): Payer: Self-pay

## 2013-10-20 MED ORDER — OXYCODONE-ACETAMINOPHEN 5-325 MG PO TABS: 1.0000 | ORAL_TABLET | Freq: Four times a day (QID) | ORAL | Status: DC | PRN

## 2013-10-21 ENCOUNTER — Telehealth (INDEPENDENT_AMBULATORY_CARE_PROVIDER_SITE_OTHER): Payer: Self-pay

## 2013-10-21 NOTE — Telephone Encounter (Signed)
Patient states she has a clear discharge from the pilonidal cyst wound , denies redness , odor temp. Advised her it is normal to have clear drainage, she is to call if her condition changes. P/O appt 11-05-13 10:50 am patient verbalized understanding

## 2013-10-29 ENCOUNTER — Encounter: Payer: Self-pay | Admitting: Family Medicine

## 2013-10-29 ENCOUNTER — Ambulatory Visit (INDEPENDENT_AMBULATORY_CARE_PROVIDER_SITE_OTHER): Payer: Medicaid Other | Admitting: Family Medicine

## 2013-10-29 VITALS — BP 124/75 | HR 85 | Temp 98.5°F | Wt 182.0 lb

## 2013-10-29 DIAGNOSIS — H6690 Otitis media, unspecified, unspecified ear: Secondary | ICD-10-CM

## 2013-10-29 DIAGNOSIS — H6691 Otitis media, unspecified, right ear: Secondary | ICD-10-CM | POA: Insufficient documentation

## 2013-10-29 DIAGNOSIS — J309 Allergic rhinitis, unspecified: Secondary | ICD-10-CM

## 2013-10-29 MED ORDER — ANTIPYRINE-BENZOCAINE 5.4-1.4 % OT SOLN
3.0000 [drp] | OTIC | Status: DC | PRN
Start: 2013-10-29 — End: 2014-06-12

## 2013-10-29 MED ORDER — AMOXICILLIN 500 MG PO TABS: 500.0000 mg | ORAL_TABLET | Freq: Two times a day (BID) | ORAL | Status: DC

## 2013-10-29 MED ORDER — LORATADINE 10 MG PO TABS: 10.0000 mg | ORAL_TABLET | Freq: Every day | ORAL | Status: DC

## 2013-10-29 MED ORDER — FLUTICASONE PROPIONATE 50 MCG/ACT NA SUSP: 2.0000 | Freq: Every day | NASAL | Status: DC

## 2013-10-29 MED ORDER — FLUCONAZOLE 150 MG PO TABS: 150.0000 mg | ORAL_TABLET | ORAL | Status: DC

## 2013-10-29 NOTE — Assessment & Plan Note (Signed)
For ear infection 1. Amoxicillin antibiotic for 10 days, followed by diflucan x 2 doses.  2. Auralgan ear drops for pain

## 2013-10-29 NOTE — Progress Notes (Signed)
   Subjective:    Patient ID: Ashley Erickson, female    DOB: 03/19/1976, 38 y.o.   MRN: [id number] CC: R ear pain x 1 days.  HPI 38 yo hearing impaired female presents for SD visit. ASL interpreter present.   1. R otalgia: x one day. No fever. Pain only. No L ear pain.   2. Allergies: runny and itchy eyes. Runny nose. Dry cough. Taking a day and night allergy medicine. Does not recall the name.   Soc hx: non smoker  Review of Systems As per HPI     Objective:   Physical Exam BP 124/75  Pulse 85  Temp(Src) 98.5 F (36.9 C) (Oral)  Wt 182 lb (82.555 kg)  LMP 10/06/2013 General appearance: alert, cooperative and no distress Eyes: conjunctivae/corneas clear. PERRL, EOM's intact.  Ears: normal TM and external ear canal left ear and abnormal TM right ear - erythematous and purulent middle ear fluid Nose: no discharge, turbinates pink, swollen Throat: lips, mucosa, and tongue normal; teeth and gums normal Neck: no adenopathy, no carotid bruit, no JVD and supple, symmetrical, trachea midline Lungs: clear to auscultation bilaterally Heart: regular rate and rhythm, S1, S2 normal, no murmur, click, rub or gallop    Assessment & Plan:

## 2013-10-29 NOTE — Assessment & Plan Note (Signed)
For allergy  1. claritin tablet once daily  2. flonase nasal steroid into both nostrils once daily

## 2013-10-29 NOTE — Patient Instructions (Addendum)
Ashley Erickson,  Thank you for coming in today. You have a R inner ear infection and allergic rhinitis.  For this please do the following: For ear infection 1. Amoxicillin antibiotic for 10 days, followed by diflucan x 2 doses.  2. Auralgan ear drops for pain    For allergy  1. claritin tablet once daily  2. flonase nasal steroid into both nostrils once daily   Call and f/u for worsening ear pain or fever.   Dr. Adrian Blackwater

## 2013-10-30 ENCOUNTER — Telehealth (INDEPENDENT_AMBULATORY_CARE_PROVIDER_SITE_OTHER): Payer: Self-pay | Admitting: General Surgery

## 2013-10-30 NOTE — Telephone Encounter (Signed)
Via sign-language interpreter, pt called to report her wound is open and painful.  Attempted to explain the surgery was >2 weeks ago and will now heal "from the inside to the outside."  Also attempted to explain how to care for it now, but she is distraught and doesn't understand why we cannot resuture it closed.  She wants the physician to see it now, so offered Urgent office.  Appt made for tomorrow afternoon (soonest.)

## 2013-10-31 ENCOUNTER — Encounter (INDEPENDENT_AMBULATORY_CARE_PROVIDER_SITE_OTHER): Payer: Self-pay | Admitting: General Surgery

## 2013-10-31 ENCOUNTER — Ambulatory Visit (INDEPENDENT_AMBULATORY_CARE_PROVIDER_SITE_OTHER): Payer: Medicaid Other | Admitting: General Surgery

## 2013-10-31 ENCOUNTER — Telehealth (INDEPENDENT_AMBULATORY_CARE_PROVIDER_SITE_OTHER): Payer: Self-pay

## 2013-10-31 VITALS — BP 124/78 | HR 80 | Temp 97.6°F | Resp 14 | Wt 182.0 lb

## 2013-10-31 DIAGNOSIS — Z9889 Other specified postprocedural states: Secondary | ICD-10-CM

## 2013-10-31 NOTE — Progress Notes (Signed)
Ashley Erickson is a 38 y.o. female who is status post a excision of pilonidal cyst on 4/16.  She is here today because part of her wound has opened.  It is draining some fluid that is greenish in color.    Objective: Filed Vitals:   10/31/13 1356  BP: 124/78  Pulse: 80  Temp: 97.6 F (36.4 C)  Resp: 14    General appearance: alert and cooperative  Incision: healing well, no significant drainage   Assessment: s/p  Patient Active Problem List   Diagnosis Date Noted  . Right otitis media 10/29/2013  . Pilonidal cyst 10/07/2013  . Rash 08/15/2013  . Left ankle pain 04/16/2013  . Allergic rhinitis 04/16/2013  . Hearing impaired person 08/07/2012  . Menometrorrhagia 07/17/2011    Plan: Wound appears clean. No signs of infection.  RTO on 5/6 for repeat evaluation by Dr Ninfa Linden and probably suture removal at that time.      [name], [name], [place] [phone]   10/31/2013 2:07 PM

## 2013-10-31 NOTE — Patient Instructions (Signed)
Return to the office on 5/6 for repeat evaluation.

## 2013-10-31 NOTE — Telephone Encounter (Signed)
Called and left message for patient to see if she can come in at 1:30 pm today to see Dr. Marcello Moores.

## 2013-11-05 ENCOUNTER — Encounter (INDEPENDENT_AMBULATORY_CARE_PROVIDER_SITE_OTHER): Payer: Self-pay | Admitting: Surgery

## 2013-11-05 ENCOUNTER — Ambulatory Visit (INDEPENDENT_AMBULATORY_CARE_PROVIDER_SITE_OTHER): Payer: Medicaid Other | Admitting: Surgery

## 2013-11-05 VITALS — BP 122/71 | HR 78 | Temp 98.4°F | Resp 16 | Ht 65.0 in | Wt 181.4 lb

## 2013-11-05 DIAGNOSIS — Z9889 Other specified postprocedural states: Secondary | ICD-10-CM

## 2013-11-05 NOTE — Progress Notes (Signed)
Subjective:     Patient ID: Ashley Erickson, female   DOB: 10-29-1975, 38 y.o.   MRN: [id number]  HPI She is here for her first postop visit status post excision of a chronic right buttock cyst. She is accompanied by her interpreter. She has minimal discomfort and has been doing well  Review of Systems     Objective:   Physical Exam On exam, the incision is healed except for the lower portion. There is no evidence of infection. I removed all the sutures. The final pathology showed chronic inflammation and a benign cyst with no evidence of malignancy    Assessment:     Patient stable postop     Plan:     She may resume her normal activity. She will return if her drainage persists. I will see her currently as needed

## 2013-12-01 ENCOUNTER — Encounter (HOSPITAL_COMMUNITY): Payer: Self-pay | Admitting: Emergency Medicine

## 2013-12-01 ENCOUNTER — Emergency Department (INDEPENDENT_AMBULATORY_CARE_PROVIDER_SITE_OTHER)
Admission: EM | Admit: 2013-12-01 | Discharge: 2013-12-01 | Disposition: A | Discharge status: Home / Self Care | Payer: Medicaid Other | Source: Home / Self Care

## 2013-12-01 DIAGNOSIS — K2990 Gastroduodenitis, unspecified, without bleeding: Secondary | ICD-10-CM

## 2013-12-01 DIAGNOSIS — R109 Unspecified abdominal pain: Secondary | ICD-10-CM

## 2013-12-01 DIAGNOSIS — K2970 Gastritis, unspecified, without bleeding: Secondary | ICD-10-CM

## 2013-12-01 DIAGNOSIS — R10811 Right upper quadrant abdominal tenderness: Secondary | ICD-10-CM

## 2013-12-01 DIAGNOSIS — R110 Nausea: Secondary | ICD-10-CM

## 2013-12-01 MED ORDER — ONDANSETRON 4 MG PO TBDP
4.0000 mg | ORAL_TABLET | Freq: Once | ORAL | Status: AC
  Administered 2013-12-01: 4 mg via ORAL

## 2013-12-01 MED ORDER — FAMOTIDINE 20 MG PO TABS
ORAL_TABLET | ORAL | Status: AC
  Filled 2013-12-01: qty 1

## 2013-12-01 MED ORDER — FAMOTIDINE 20 MG PO TABS
20.0000 mg | ORAL_TABLET | Freq: Once | ORAL | Status: AC
  Administered 2013-12-01: 20 mg via ORAL

## 2013-12-01 MED ORDER — OMEPRAZOLE 20 MG PO CPDR: 20.0000 mg | DELAYED_RELEASE_CAPSULE | Freq: Two times a day (BID) | ORAL | Status: DC

## 2013-12-01 MED ORDER — ONDANSETRON 4 MG PO TBDP
ORAL_TABLET | ORAL | Status: AC
  Filled 2013-12-01: qty 1

## 2013-12-01 MED ORDER — ONDANSETRON HCL 4 MG PO TABS: 4.0000 mg | ORAL_TABLET | Freq: Three times a day (TID) | ORAL | Status: DC | PRN

## 2013-12-01 NOTE — ED Provider Notes (Signed)
CSN: [id number]     Arrival date & time 12/01/13  1046 History   First MD Initiated Contact with Patient 12/01/13 1229     Chief Complaint  Patient presents with  . Abdominal Pain   (Consider location/radiation/quality/duration/timing/severity/associated sxs/prior Treatment) HPI Comments: 38 year old female accompanied by a some language interpreter states that 2 days ago after work she went to a restaurant to eat. After a few bites of food she developed nausea the pain improved some. She continued to eat. A few minutes later the nausea increased dramatically and was followed by vomiting. There were traces of blood in the emesis. The next morning she was having mid abdominal pain and noticed that eating and drinking exacerbated the pain. The vomiting occurred only once but the nausea and abdominal pain has persisted. The patient points to the area just above the umbilicus as the source of pain. She has a history of gastric ulcers. In addition she has had an appendectomy and fibroidectomy.   Past Medical History  Diagnosis Date  . Deaf   . Menometrorrhagia 07/17/2011  . Fibroid   . Ulcer "years ago"    Diet controlled, has not needed any medications for this    Past Surgical History  Procedure Laterality Date  . Appendectomy  1994  . Cesarean section  2008  . Myomectomy abdominal approach  2004   Family History  Problem Relation Age of Onset  . Seizures Sister   . Anesthesia problems Neg Hx    History  Substance Use Topics  . Smoking status: Never Smoker   . Smokeless tobacco: Not on file  . Alcohol Use: 0.0 oz/week    0 Glasses of wine per week   OB History   Grav Para Term Preterm Abortions TAB SAB Ect Mult Living   2 2 2  0 0 0 0 0 0 2     Review of Systems  Constitutional: Positive for activity change and appetite change. Negative for fever and fatigue.  Respiratory: Negative.   Gastrointestinal:       As per history of present illness She had a bowel movement  yesterday and the day before. Denies diarrhea. The pain she may be constipated.  Genitourinary: Negative for dysuria, urgency, frequency, vaginal discharge and pelvic pain.       Vulvar itching  Skin: Negative for rash.    Allergies  Ibuprofen  Home Medications   Prior to Admission medications   Medication Sig Start Date End Date Taking? Authorizing Provider  amoxicillin (AMOXIL) 500 MG tablet Take 1 tablet (500 mg total) by mouth 2 (two) times daily. 10/29/13   Minerva Ends, MD  antipyrine-benzocaine Toniann Fail) otic solution Place 3-4 drops into the right ear every 2 (two) hours as needed for ear pain. 10/29/13   Josalyn C Funches, MD  desonide (DESOWEN) 0.05 % cream Apply topically 2 (two) times daily. 08/06/12   Alveda Reasons, MD  fluconazole (DIFLUCAN) 150 MG tablet Take 1 tablet (150 mg total) by mouth every 3 (three) days. After  Completing amoxicillin to prevent yeast infection 10/29/13   Josalyn C Funches, MD  fluticasone (FLONASE) 50 MCG/ACT nasal spray Place 2 sprays into both nostrils daily. 10/29/13   Josalyn C Funches, MD  loratadine (CLARITIN) 10 MG tablet Take 1 tablet (10 mg total) by mouth daily. 10/29/13   Josalyn C Funches, MD  omeprazole (PRILOSEC) 20 MG capsule Take 1 capsule (20 mg total) by mouth 2 (two) times daily. Prn nausea 12/01/13   Shanon Brow  [place] Schnitker, NP  ondansetron (ZOFRAN) 4 MG tablet Take 1 tablet (4 mg total) by mouth every 8 (eight) hours as needed for nausea. May cause constipation 12/01/13   Janne Napoleon, NP  triamcinolone ointment (KENALOG) 0.1 % Apply topically 2 (two) times daily. 08/13/12   [place], DO   BP 123/79  Pulse 80  Temp(Src) 97.4 F (36.3 C) (Oral)  Resp 16  SpO2 100% Physical Exam  Nursing note and vitals reviewed. Constitutional: She is oriented to person, place, and time. She appears well-developed and well-nourished. No distress.  Eyes: Conjunctivae and EOM are normal.  Neck: Normal range of motion. Neck supple.  Cardiovascular:  Normal rate, regular rhythm and normal heart sounds.   Pulmonary/Chest: Effort normal and breath sounds normal. No respiratory distress.  Abdominal: Soft. She exhibits no mass. There is no rebound and no guarding.  Bowel sounds present but hypoactive Tenderness in the epigastrium and left upper quadrant. Positive Murphy sign.  Neurological: She is alert and oriented to person, place, and time.  Skin: Skin is warm and dry.  Psychiatric: She has a normal mood and affect.    ED Course  Procedures (including critical care time) Labs Review Labs Reviewed - No data to display  Imaging Review No results found.   MDM   1. Abdominal pain   2. Nausea   3. Gastritis   4. RUQ abdominal tenderness     The differential includes gastritis, PUD and cholestatic disease. Will treat medically with PPIs and Zofran for now. She will need to followup with a PCP to have a more extensive workup to include abdominal ultrasound and blood work. Although she is somewhat uncomfortable with her abdominal pain she is in no acute distress, no active vomiting. I believe that she will do satisfactorily with medical treatment until she can have the workup and management as above. Fo rworsening, increased pain, fever, vomiting, other go to the ED No ascitic foods or drink. No heavy meals or fast foods. Repeat instructions for diet. Meeting with Maggie to assist in locating and obtaining appt with PCP    Janne Napoleon, NP 12/01/13 1306

## 2013-12-01 NOTE — ED Notes (Signed)
Pt      Reports a  History  Of  Stomach  Problems  To  Include  Abdominal  Pain       With  Vomiting  And  Noticed  Some  Bleeding        With  The    Vomiting

## 2013-12-01 NOTE — Discharge Instructions (Signed)
Abdominal Pain, Adult Many things can cause abdominal pain. Usually, abdominal pain is not caused by a disease and will improve without treatment. It can often be observed and treated at home. Your health care provider will do a physical exam and possibly order blood tests and X-rays to help determine the seriousness of your pain. However, in many cases, more time must pass before a clear cause of the pain can be found. Before that point, your health care provider may not know if you need more testing or further treatment. HOME CARE INSTRUCTIONS  Monitor your abdominal pain for any changes. The following actions may help to alleviate any discomfort you are experiencing:  Only take over-the-counter or prescription medicines as directed by your health care provider.  Do not take laxatives unless directed to do so by your health care provider.  Try a clear liquid diet (broth, tea, or water) as directed by your health care provider. Slowly move to a bland diet as tolerated. SEEK MEDICAL CARE IF:  You have unexplained abdominal pain.  You have abdominal pain associated with nausea or diarrhea.  You have pain when you urinate or have a bowel movement.  You experience abdominal pain that wakes you in the night.  You have abdominal pain that is worsened or improved by eating food.  You have abdominal pain that is worsened with eating fatty foods. SEEK IMMEDIATE MEDICAL CARE IF:   Your pain does not go away within 2 hours.  You have a fever.  You keep throwing up (vomiting).  Your pain is felt only in portions of the abdomen, such as the right side or the left lower portion of the abdomen.  You pass bloody or black tarry stools. MAKE SURE YOU:  Understand these instructions.   Will watch your condition.   Will get help right away if you are not doing well or get worse.  Document Released: 03/29/2005 Document Revised: 04/09/2013 Document Reviewed: 02/26/2013 [facility] Patient  Information 2014 [place].  Abdominal Pain, Adult Many things can cause belly (abdominal) pain. Most times, the belly pain is not dangerous. Many cases of belly pain can be watched and treated at home. HOME CARE   Do not take medicines that help you go poop (laxatives) unless told to by your doctor.  Only take medicine as told by your doctor.  Eat or drink as told by your doctor. Your doctor will tell you if you should be on a special diet. GET HELP IF:  You do not know what is causing your belly pain.  You have belly pain while you are sick to your stomach (nauseous) or have runny poop (diarrhea).  You have pain while you pee or poop.  Your belly pain wakes you up at night.  You have belly pain that gets worse or better when you eat.  You have belly pain that gets worse when you eat fatty foods. GET HELP RIGHT AWAY IF:   The pain does not go away within 2 hours.  You have a fever.  You keep throwing up (vomiting).  The pain changes and is only in the right or left part of the belly.  You have bloody or tarry looking poop. MAKE SURE YOU:   Understand these instructions.  Will watch your condition.  Will get help right away if you are not doing well or get worse. Document Released: 12/06/2007 Document Revised: 04/09/2013 Document Reviewed: 02/26/2013 [facility] Patient Information 2014 [place].  Gastritis, Adult Gastritis is soreness  and swelling (inflammation) of the lining of the stomach. Gastritis can develop as a sudden onset (acute) or long-term (chronic) condition. If gastritis is not treated, it can lead to stomach bleeding and ulcers. CAUSES  Gastritis occurs when the stomach lining is weak or damaged. Digestive juices from the stomach then inflame the weakened stomach lining. The stomach lining may be weak or damaged due to viral or bacterial infections. One common bacterial infection is the Helicobacter pylori infection. Gastritis can also result  from excessive alcohol consumption, taking certain medicines, or having too much acid in the stomach.  SYMPTOMS  In some cases, there are no symptoms. When symptoms are present, they may include:  Pain or a burning sensation in the upper abdomen.  Nausea.  Vomiting.  An uncomfortable feeling of fullness after eating. DIAGNOSIS  Your caregiver may suspect you have gastritis based on your symptoms and a physical exam. To determine the cause of your gastritis, your caregiver may perform the following:  Blood or stool tests to check for the H pylori bacterium.  Gastroscopy. A thin, flexible tube (endoscope) is passed down the esophagus and into the stomach. The endoscope has a light and camera on the end. Your caregiver uses the endoscope to view the inside of the stomach.  Taking a tissue sample (biopsy) from the stomach to examine under a microscope. TREATMENT  Depending on the cause of your gastritis, medicines may be prescribed. If you have a bacterial infection, such as an H pylori infection, antibiotics may be given. If your gastritis is caused by too much acid in the stomach, H2 blockers or antacids may be given. Your caregiver may recommend that you stop taking aspirin, ibuprofen, or other nonsteroidal anti-inflammatory drugs (NSAIDs). HOME CARE INSTRUCTIONS  Only take over-the-counter or prescription medicines as directed by your caregiver.  If you were given antibiotic medicines, take them as directed. Finish them even if you start to feel better.  Drink enough fluids to keep your urine clear or pale yellow.  Avoid foods and drinks that make your symptoms worse, such as:  Caffeine or alcoholic drinks.  Chocolate.  Peppermint or mint flavorings.  Garlic and onions.  Spicy foods.  Citrus fruits, such as oranges, lemons, or limes.  Tomato-based foods such as sauce, chili, salsa, and pizza.  Fried and fatty foods.  Eat small, frequent meals instead of large  meals. SEEK IMMEDIATE MEDICAL CARE IF:   You have black or dark red stools.  You vomit blood or material that looks like coffee grounds.  You are unable to keep fluids down.  Your abdominal pain gets worse.  You have a fever.  You do not feel better after 1 week.  You have any other questions or concerns. MAKE SURE YOU:  Understand these instructions.  Will watch your condition.  Will get help right away if you are not doing well or get worse. Document Released: 06/13/2001 Document Revised: 12/19/2011 Document Reviewed: 08/02/2011 [facility] Patient Information 2014 [place].  Nausea and Vomiting Nausea means you feel sick to your stomach. Throwing up (vomiting) is a reflex where stomach contents come out of your mouth. HOME CARE   Take medicine as told by your doctor.  Do not force yourself to eat. However, you do need to drink fluids.  If you feel like eating, eat a normal diet as told by your doctor.  Eat rice, wheat, potatoes, bread, lean meats, yogurt, fruits, and vegetables.  Avoid high-fat foods.  Drink enough fluids to keep  your pee (urine) clear or pale yellow.  Ask your doctor how to replace body fluid losses (rehydrate). Signs of body fluid loss (dehydration) include:  Feeling very thirsty.  Dry lips and mouth.  Feeling dizzy.  Dark pee.  Peeing less than normal.  Feeling confused.  Fast breathing or heart rate. GET HELP RIGHT AWAY IF:   You have blood in your throw up.  You have black or bloody poop (stool).  You have a bad headache or stiff neck.  You feel confused.  You have bad belly (abdominal) pain.  You have chest pain or trouble breathing.  You do not pee at least once every 8 hours.  You have cold, clammy skin.  You keep throwing up after 24 to 48 hours.  You have a fever. MAKE SURE YOU:   Understand these instructions.  Will watch your condition.  Will get help right away if you are not doing well or get  worse. Document Released: 12/06/2007 Document Revised: 09/11/2011 Document Reviewed: 11/18/2010 [facility] Patient Information 2014 [place], [place].

## 2013-12-02 NOTE — ED Provider Notes (Signed)
Medical screening examination/treatment/procedure(s) were performed by non-physician practitioner and as supervising physician I was immediately available for consultation/collaboration.  Philipp Deputy, M.D.  Harden Mo, MD 12/02/13 [id number]

## 2013-12-08 ENCOUNTER — Ambulatory Visit (INDEPENDENT_AMBULATORY_CARE_PROVIDER_SITE_OTHER): Payer: Medicaid Other | Admitting: Family Medicine

## 2013-12-08 ENCOUNTER — Encounter: Payer: Self-pay | Admitting: Family Medicine

## 2013-12-08 ENCOUNTER — Ambulatory Visit (HOSPITAL_COMMUNITY)
Admission: RE | Admit: 2013-12-08 | Discharge: 2013-12-08 | Disposition: A | Discharge status: Home / Self Care | Payer: Medicaid Other | Source: Ambulatory Visit | Attending: Family Medicine | Admitting: Family Medicine

## 2013-12-08 VITALS — BP 133/69 | HR 96 | Temp 99.2°F | Ht 65.0 in | Wt 178.0 lb

## 2013-12-08 DIAGNOSIS — R1013 Epigastric pain: Secondary | ICD-10-CM | POA: Insufficient documentation

## 2013-12-08 LAB — COMPREHENSIVE METABOLIC PANEL
ALBUMIN: 4.2 g/dL (ref 3.5–5.2)
ALK PHOS: 91 U/L (ref 39–117)
ALT: 16 U/L (ref 0–35)
AST: 17 U/L (ref 0–37)
BUN: 17 mg/dL (ref 6–23)
CO2: 24 mEq/L (ref 19–32)
Calcium: 9 mg/dL (ref 8.4–10.5)
Chloride: 103 mEq/L (ref 96–112)
Creat: 0.91 mg/dL (ref 0.50–1.10)
Glucose, Bld: 89 mg/dL (ref 70–99)
POTASSIUM: 4.3 meq/L (ref 3.5–5.3)
SODIUM: 137 meq/L (ref 135–145)
TOTAL PROTEIN: 6.9 g/dL (ref 6.0–8.3)
Total Bilirubin: 0.3 mg/dL (ref 0.2–1.2)

## 2013-12-08 LAB — CBC
HCT: 35.4 % — ABNORMAL LOW (ref 36.0–46.0)
Hemoglobin: 11.7 g/dL — ABNORMAL LOW (ref 12.0–15.0)
MCH: 22.9 pg — AB (ref 26.0–34.0)
MCHC: 33.1 g/dL (ref 30.0–36.0)
MCV: 69.3 fL — ABNORMAL LOW (ref 78.0–100.0)
PLATELETS: 282 10*3/uL (ref 150–400)
RBC: 5.11 MIL/uL (ref 3.87–5.11)
RDW: 17 % — ABNORMAL HIGH (ref 11.5–15.5)
WBC: 4.2 10*3/uL (ref 4.0–10.5)

## 2013-12-08 LAB — LIPASE: Lipase: 10 U/L (ref 0–75)

## 2013-12-08 NOTE — Patient Instructions (Addendum)
Dear Ms. Sykora,   Thank you for coming to clinic today. Please read below regarding the issues that we discussed.   Abdominal pain - I need to get several tests including a breath test, blood test, and ultrasound. I will let you know the results.   Please follow up in clinic in 1 week. Please call earlier if you have any questions or concerns.   Sincerely,   Dr. Maricela Bo

## 2013-12-08 NOTE — Progress Notes (Signed)
   Subjective:    Patient ID: Ashley Erickson, female    DOB: 02-22-1976, 38 y.o.   MRN: [id number]  HPI  38 year old F who presents for evaluation of abdominal pain and hematemesis. She started having abdominal pain 8 days ago that started suddenly. It was associated with nausea, vomiting, and hematemesis. 2 days later she went to urgent care and was evaluated for this and considered to be gastritis. She was given a prescriptions for prilosec and zofran, but she did not fill this. She thinks that she needed to have an ultrasound before this.   In the last week since her visit, she continues to have pain. It is located in the sub-xyphoid area. She denies odynophagia or dysphagia. She is eating less. No diarrhea. She denies a history gall bladder disease or pancreatitis. She drinks minimal alcohol. She eats lots of spicy food, even though she was told not to.   PMH - Hx of PUD disease   Review of Systems     Objective:   Physical Exam BP 133/69  Pulse 96  Temp(Src) 99.2 F (37.3 C) (Oral)  Ht 5\' 5"  (1.651 m)  Wt 178 lb (80.74 kg)  BMI 29.62 kg/m2  Gen: middle age female, deaf and accompanied by a sign language interpreter, non ill appearing OP: clear and moist, no halitosis  CV: RRR, no murmurs, 2+ pulses, brisk cap refill Pulm: CTA-B Abd: soft, ND, marked superior epigastric tenderness to light palpation, negative murphy's sign       Assessment & Plan:

## 2013-12-08 NOTE — Assessment & Plan Note (Signed)
A: epigastric pain with intermittent nausea and vomiting combined with my exam has me concerned for pancreatitis, cholecystitis and/or PUD P:  - obtain ultrasound to eval biliary tree, gall bladder and pancreas - obtain urea breath test to assess H. Pylori (given hx of PUD I'd expect positive H. Pylori serology) - lipase, CBC, and CMET for further eval

## 2013-12-09 ENCOUNTER — Telehealth: Payer: Self-pay | Admitting: Family Medicine

## 2013-12-09 LAB — H. PYLORI BREATH TEST: H. PYLORI BREATH TEST: DETECTED — AB

## 2013-12-09 NOTE — Telephone Encounter (Signed)
Message left for patient through hearing-impaired answering service. I asked for her to come in to discuss the results of the labs and ultrasound. When she comes for a visit, she will need treatment for H. Pylori, and I will make a referral to GI for endoscopy if she has any more hematemesis.

## 2013-12-24 ENCOUNTER — Ambulatory Visit: Payer: Medicaid Other | Admitting: Family Medicine

## 2013-12-31 ENCOUNTER — Ambulatory Visit (INDEPENDENT_AMBULATORY_CARE_PROVIDER_SITE_OTHER): Payer: Medicaid Other | Admitting: Family Medicine

## 2013-12-31 ENCOUNTER — Encounter: Payer: Self-pay | Admitting: Family Medicine

## 2013-12-31 VITALS — BP 138/70 | HR 74 | Temp 98.1°F | Wt 178.0 lb

## 2013-12-31 DIAGNOSIS — R1013 Epigastric pain: Secondary | ICD-10-CM

## 2013-12-31 DIAGNOSIS — A048 Other specified bacterial intestinal infections: Secondary | ICD-10-CM

## 2013-12-31 MED ORDER — CLARITHROMYCIN 500 MG PO TABS: 500.0000 mg | ORAL_TABLET | Freq: Two times a day (BID) | ORAL | Status: DC

## 2013-12-31 MED ORDER — AMOXICILLIN 500 MG PO CAPS: 1000.0000 mg | ORAL_CAPSULE | Freq: Two times a day (BID) | ORAL | Status: DC

## 2013-12-31 MED ORDER — OMEPRAZOLE 20 MG PO CPDR: 20.0000 mg | DELAYED_RELEASE_CAPSULE | Freq: Two times a day (BID) | ORAL | Status: DC

## 2013-12-31 NOTE — Progress Notes (Signed)
   Subjective:    Patient ID: Ashley Erickson, female    DOB: 12-27-1975, 38 y.o.   MRN: [id number]  HPI: Pt presents to clinic for f/u of abdominal pain to check on [place] and lab results. Pt is deaf; in-person sign language interpretation service utilized. [place] was normal and lab results show that pt is positive for H.pylori. Pt endorses history of PUD and current chronic epigastric pain, worse intermittently with certain types of foods (especially spicy foods), essentially unchanged from previous visit with Dr. Maricela Bo. She denies headache, chest pain, SOB, LE swelling, fever / chills, frank vomiting, blood in emesis or stool. Previously had blood in emesis and does still have some nausea. Pt has never had treatment for H.pylori and states she has never heard of it. She reiterates that she did not pick up previously prescribed PPI due to wanting to get her ultrasound, first.  Review of Systems: As above.     Objective:   Physical Exam BP 138/70  Pulse 74  Temp(Src) 98.1 F (36.7 C) (Oral)  Wt 178 lb (80.74 kg) Gen: well-appearing adult female in NAD HEENT: [place]/AT, EOMI, PERRLA Cardio: RRR, no murmur Pulm: CTAB, no wheezes Abd: markedly tender in epigastrium but otherwise soft, nontender, with normoactive bowel sounds  No Murphy's sign  Negative McBurney point tenderness  Otherwise nonperitoneal Ext: warm, well-perfused, no LE edema     Assessment & Plan:  38yo with hx of PUD and positive H.pylori breath testing, still with persistent epigastric pain.  Plan to treat with triple therapy; Rx's provided today. - amoxicillin 1 g BID for 14 days - clarithromycin 500 mg BID for 14 days - omeprazole 20 mg BID for at least 14 days - instructed pt to use these medications as directed and discussed reasons for prescriptions / expected treatment course at length - advised pt to f/u with PCP Dr. Mingo Amber after she completes the antibiotics - defer to PCP whether or not to continue PPI after  completing antibiotics - reviewed red flags that would prompt immediate return to care (including worsening symptoms, fever / chills, SOB, reaction to medications, etc)  Emmaline Kluver, MD PGY-3, [facility] 12/31/2013, 8:23 PM

## 2013-12-31 NOTE — Patient Instructions (Signed)
Thank you for coming in, today!  You have an infection of bacteria in your stomach called "H. Pylori" This bacteria can cause ulcers and "gastritis" (irritated stomach) that can cause the pain you're having. You should take three medications to treat this.  Omeprazole 20 mg twice per day - this is a PPI (proton pump inhibitor) that reduces the acid in your stomach Amoxicillin 1000 mg (1 g) twice per day - this is an antibiotic that kills the bacteria Clarithromycin 500 mg twice per day - this is a second antibiotic that helps the amoxicillin work better  You should take the amoxicillin and clarithromycin twice a day for 14 days. You should take the omeprazole with these medications, but keep taking it after the antibiotics are finished.  Come back to see Dr. Mingo Amber after you finish your antibiotics. He will talk to you about whether or not to continue the omeprazole. If your symptoms get worse instead of better, come back sooner. Please feel free to call with any questions or concerns at any time, at [phone]. --Dr. Venetia Maxon

## 2014-01-24 ENCOUNTER — Encounter (HOSPITAL_COMMUNITY): Payer: Self-pay | Admitting: Emergency Medicine

## 2014-01-24 ENCOUNTER — Emergency Department (INDEPENDENT_AMBULATORY_CARE_PROVIDER_SITE_OTHER)
Admission: EM | Admit: 2014-01-24 | Discharge: 2014-01-24 | Disposition: A | Discharge status: Home / Self Care | Payer: Medicaid Other | Source: Home / Self Care | Attending: Emergency Medicine | Admitting: Emergency Medicine

## 2014-01-24 DIAGNOSIS — M719 Bursopathy, unspecified: Secondary | ICD-10-CM

## 2014-01-24 DIAGNOSIS — M67919 Unspecified disorder of synovium and tendon, unspecified shoulder: Secondary | ICD-10-CM | POA: Diagnosis not present

## 2014-01-24 DIAGNOSIS — M7541 Impingement syndrome of right shoulder: Secondary | ICD-10-CM

## 2014-01-24 MED ORDER — IBUPROFEN 600 MG PO TABS: 600.0000 mg | ORAL_TABLET | Freq: Four times a day (QID) | ORAL | Status: DC | PRN

## 2014-01-24 MED ORDER — METHYLPREDNISOLONE ACETATE 40 MG/ML IJ SUSP
INTRAMUSCULAR | Status: AC
  Filled 2014-01-24: qty 5

## 2014-01-24 NOTE — Discharge Instructions (Signed)
You have inflammation and irritation of your rotator cuff.  This is likely from the increased work load at your new job. We did an injection in the shoulder today to help.  It will take 1-2 days to reach full effect. Ice your shoulder tonight and 2-3 times a day for the next 3 days. Take ibuprofen 600mg  3 times a day for the next week, then as needed. Rest your shoulder for the next 3 days.  Followup with your regular doctor if you are still having pain in 1-2 weeks.

## 2014-01-24 NOTE — ED Provider Notes (Signed)
CSN: [id number]     Arrival date & time 01/24/14  1807 History   First MD Initiated Contact with Patient 01/24/14 [place]     Chief Complaint  Patient presents with  . URI  . Shoulder Pain   (Consider location/radiation/quality/duration/timing/severity/associated sxs/prior Treatment) HPI She is here today with a sign language interpreter for evaluation of right shoulder pain. She states the pain is located anteriorly and is worse with pushing and pulling movement. It is also worse with overhead movements. He has tried a topical cream as well as some ibuprofen without improvement. She has also been icing intermittently with minimal improvement. 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 No radiating pain. No numbness, tingling, weakness in the arm.  She also reports nasal congestion and drainage that started yesterday. She denies any fevers or cough.  Past Medical History  Diagnosis Date  . Deaf   . Menometrorrhagia 07/17/2011  . Fibroid   . Ulcer "years ago"    Diet controlled, has not needed any medications for this    Past Surgical History  Procedure Laterality Date  . Appendectomy  1994  . Cesarean section  2008  . Myomectomy abdominal approach  2004   Family History  Problem Relation Age of Onset  . Seizures Sister   . Anesthesia problems Neg Hx    History  Substance Use Topics  . Smoking status: Never Smoker   . Smokeless tobacco: Not on file  . Alcohol Use: 0.0 oz/week    0 Glasses of wine per week   OB History   Grav Para Term Preterm Abortions TAB SAB Ect Mult Living   2 2 2  0 0 0 0 0 0 2     Review of Systems  HENT: Positive for congestion and rhinorrhea.   Respiratory: Negative for cough and shortness of breath.   Musculoskeletal:       Right shoulder pain    Allergies  Ibuprofen  Home Medications    Prior to Admission medications   Medication Sig Start Date End Date Taking? Authorizing Provider  amoxicillin (AMOXIL) 500 MG capsule Take 2 capsules (1,000 mg total) by mouth 2 (two) times daily. 12/31/13   Sharon Mt Street, MD  antipyrine-benzocaine Toniann Fail) otic solution Place 3-4 drops into the right ear every 2 (two) hours as needed for ear pain. 10/29/13   Josalyn C Funches, MD  clarithromycin (BIAXIN) 500 MG tablet Take 1 tablet (500 mg total) by mouth 2 (two) times daily. 12/31/13   [place], MD  desonide (DESOWEN) 0.05 % cream Apply topically 2 (two) times daily. 08/06/12   Alveda Reasons, MD  fluconazole (DIFLUCAN) 150 MG tablet Take 1 tablet (150 mg total) by mouth every 3 (three) days. After  Completing amoxicillin to prevent yeast infection 10/29/13   Josalyn C Funches, MD  fluticasone (FLONASE) 50 MCG/ACT nasal spray Place 2 sprays into both nostrils daily. 10/29/13   Josalyn C Funches, MD  ibuprofen (ADVIL,MOTRIN) 600 MG tablet Take 1 tablet (600 mg total) by mouth every 6 (six) hours as needed. 01/24/14   Melony Overly, MD  loratadine (CLARITIN) 10 MG tablet Take 1 tablet (10 mg total) by mouth daily. 10/29/13   Josalyn C Funches, MD  omeprazole (PRILOSEC) 20 MG capsule Take 1 capsule (20 mg total)  by mouth 2 (two) times daily before a meal. 12/31/13   Sharon Mt Street, MD  ondansetron (ZOFRAN) 4 MG tablet Take 1 tablet (4 mg total) by mouth every 8 (eight) hours as needed for nausea. May cause constipation 12/01/13   Janne Napoleon, NP  triamcinolone ointment (KENALOG) 0.1 % Apply topically 2 (two) times daily. 08/13/12   [place], DO   BP 117/70  Pulse 87  Temp(Src) 98.2 F (36.8 C) (Oral)  Resp 16  SpO2 99% Physical Exam  Constitutional: She appears well-developed and well-nourished. No distress.  Musculoskeletal:  Right shoulder: no erythema, edema or deformity; tender to palpation over bicipital groove; pain with overhead movement, internal and external  rotation and resisted elbow flexion.  +Hawkins.  Skin: She is not diaphoretic.    ED Course  Injection of joint Date/Time: 01/24/2014 7:51 PM Performed by: Melony Overly Authorized by: Melony Overly Consent: Verbal consent obtained. Risks and benefits: risks, benefits and alternatives were discussed Consent given by: patient Patient understanding: patient states understanding of the procedure being performed Site marked: the operative site was marked Patient identity confirmed: verbally with patient Time out: Immediately prior to procedure a "time out" was called to verify the correct patient, procedure, equipment, support staff and site/side marked as required. Preparation: Patient was prepped and draped in the usual sterile fashion. Local anesthesia used: no Patient sedated: no Patient tolerance: Patient tolerated the procedure well with no immediate complications. Comments: Right shoulder injected with 40mg  depomedral and 4cc lidocaine.  Cold spray was used for anesthesia.  Skin prepped with betadine.   (including critical care time) Labs Review Labs Reviewed - No data to display  Imaging Review No results found.   MDM   1. Rotator cuff impingement syndrome, right    Injection of Depo-Medrol and lidocaine performed on right shoulder. Recommended rest, ice, and ibuprofen 600 mg 3 times a day. Work note written to keep her out for 3 days.  Discussed using nasal saline spray for her congestion.  Followup with primary care doctor if no improvement in 1-2 weeks.    Melony Overly, MD 01/24/14 [phone]

## 2014-01-24 NOTE — ED Notes (Addendum)
Via deaf interp: Pt c/o cold sx onset yest; sx include runny nose, congestion Also c/o right shoulder pain x2 weeks; denies inj/trauma Reports she lifts heavy objects at work; increases w/activity Taking OTC pain meds w/no relief Alert w/no signs of acute distress.

## 2014-01-27 ENCOUNTER — Ambulatory Visit: Payer: Medicaid Other | Admitting: Family Medicine

## 2014-03-03 ENCOUNTER — Ambulatory Visit (INDEPENDENT_AMBULATORY_CARE_PROVIDER_SITE_OTHER): Payer: Medicaid Other | Admitting: Family Medicine

## 2014-03-03 ENCOUNTER — Encounter: Payer: Self-pay | Admitting: Family Medicine

## 2014-03-03 VITALS — BP 129/78 | HR 99 | Temp 98.0°F | Ht 65.0 in | Wt 181.6 lb

## 2014-03-03 DIAGNOSIS — M25519 Pain in unspecified shoulder: Secondary | ICD-10-CM

## 2014-03-03 DIAGNOSIS — M25511 Pain in right shoulder: Secondary | ICD-10-CM

## 2014-03-03 DIAGNOSIS — M67919 Unspecified disorder of synovium and tendon, unspecified shoulder: Secondary | ICD-10-CM

## 2014-03-03 DIAGNOSIS — M7581 Other shoulder lesions, right shoulder: Secondary | ICD-10-CM

## 2014-03-03 DIAGNOSIS — R21 Rash and other nonspecific skin eruption: Secondary | ICD-10-CM

## 2014-03-03 DIAGNOSIS — M719 Bursopathy, unspecified: Secondary | ICD-10-CM

## 2014-03-03 MED ORDER — IBUPROFEN 600 MG PO TABS: 600.0000 mg | ORAL_TABLET | Freq: Four times a day (QID) | ORAL | Status: DC | PRN

## 2014-03-03 MED ORDER — TRAMADOL HCL 50 MG PO TABS: 50.0000 mg | ORAL_TABLET | Freq: Three times a day (TID) | ORAL | Status: DC | PRN

## 2014-03-03 NOTE — Patient Instructions (Signed)
Do the shoulder exercises as below.  Take the Ibuprofen 2-3 times a day for inflammation.  When the pain is very severe, take the Tramadol.  We will refer you to Sports Medicine for an ultrasound of your shoulder and dermatology for your leg.    Shoulder Exercises EXERCISES  RANGE OF MOTION (ROM) AND STRETCHING EXERCISES These exercises may help you when beginning to rehabilitate your injury. Your symptoms may resolve with or without further involvement from your physician, physical therapist or athletic trainer. While completing these exercises, remember:   Restoring tissue flexibility helps normal motion to return to the joints. This allows healthier, less painful movement and activity.  An effective stretch should be held for at least 30 seconds.  A stretch should never be painful. You should only feel a gentle lengthening or release in the stretched tissue. ROM - Pendulum  Bend at the waist so that your right / left arm falls away from your body. Support yourself with your opposite hand on a solid surface, such as a table or a countertop.  Your right / left arm should be perpendicular to the ground. If it is not perpendicular, you need to lean over farther. Relax the muscles in your right / left arm and shoulder as much as possible.  Gently sway your hips and trunk so they move your right / left arm without any use of your right / left shoulder muscles.  Progress your movements so that your right / left arm moves side to side, then forward and backward, and finally, both clockwise and counterclockwise.  Complete __________ repetitions in each direction. Many people use this exercise to relieve discomfort in their shoulder as well as to gain range of motion. Repeat __________ times. Complete this exercise __________ times per day. STRETCH - Flexion, Standing  Stand with good posture. With an underhand grip on your right / left hand and an overhand grip on the opposite hand, grasp a  broomstick or cane so that your hands are a little more than shoulder-width apart.  Keeping your right / left elbow straight and shoulder muscles relaxed, push the stick with your opposite hand to raise your right / left arm in front of your body and then overhead. Raise your arm until you feel a stretch in your right / left shoulder, but before you have increased shoulder pain.  Try to avoid shrugging your right / left shoulder as your arm rises by keeping your shoulder blade tucked down and toward your mid-back spine. Hold __________ seconds.  Slowly return to the starting position. Repeat __________ times. Complete this exercise __________ times per day. STRETCH - Internal Rotation  Place your right / left hand behind your back, palm-up.  Throw a towel or belt over your opposite shoulder. Grasp the towel/belt with your right / left hand.  While keeping an upright posture, gently pull up on the towel/belt until you feel a stretch in the front of your right / left shoulder.  Avoid shrugging your right / left shoulder as your arm rises by keeping your shoulder blade tucked down and toward your mid-back spine.  Hold __________. Release the stretch by lowering your opposite hand. Repeat __________ times. Complete this exercise __________ times per day. STRETCH - External Rotation and Abduction  Stagger your stance through a doorframe. It does not matter which foot is forward.  As instructed by your physician, physical therapist or athletic trainer, place your hands:  And forearms above your head and on the  door frame.  And forearms at head-height and on the door frame.  At elbow-height and on the door frame.  Keeping your head and chest upright and your stomach muscles tight to prevent over-extending your low-back, slowly shift your weight onto your front foot until you feel a stretch across your chest and/or in the front of your shoulders.  Hold __________ seconds. Shift your weight  to your back foot to release the stretch. Repeat __________ times. Complete this stretch __________ times per day.  STRENGTHENING EXERCISES  These exercises may help you when beginning to rehabilitate your injury. They may resolve your symptoms with or without further involvement from your physician, physical therapist or athletic trainer. While completing these exercises, remember:   Muscles can gain both the endurance and the strength needed for everyday activities through controlled exercises.  Complete these exercises as instructed by your physician, physical therapist or athletic trainer. Progress the resistance and repetitions only as guided.  You may experience muscle soreness or fatigue, but the pain or discomfort you are trying to eliminate should never worsen during these exercises. If this pain does worsen, stop and make certain you are following the directions exactly. If the pain is still present after adjustments, discontinue the exercise until you can discuss the trouble with your clinician.  If advised by your physician, during your recovery, avoid activity or exercises which involve actions that place your right / left hand or elbow above your head or behind your back or head. These positions stress the tissues which are trying to heal. STRENGTH - Scapular Depression and Adduction  With good posture, sit on a firm chair. Supported your arms in front of you with pillows, arm rests or a table top. Have your elbows in line with the sides of your body.  Gently draw your shoulder blades down and toward your mid-back spine. Gradually increase the tension without tensing the muscles along the top of your shoulders and the back of your neck.  Hold for __________ seconds. Slowly release the tension and relax your muscles completely before completing the next repetition.  After you have practiced this exercise, remove the arm support and complete it in standing as well as sitting. Repeat  __________ times. Complete this exercise __________ times per day.  STRENGTH - External Rotators  Secure a rubber exercise band/tubing to a fixed object so that it is at the same height as your right / left elbow when you are standing or sitting on a firm surface.  Stand or sit so that the secured exercise band/tubing is at your side that is not injured.  Bend your elbow 90 degrees. Place a folded towel or small pillow under your right / left arm so that your elbow is a few inches away from your side.  Keeping the tension on the exercise band/tubing, pull it away from your body, as if pivoting on your elbow. Be sure to keep your body steady so that the movement is only coming from your shoulder rotating.  Hold __________ seconds. Release the tension in a controlled manner as you return to the starting position. Repeat __________ times. Complete this exercise __________ times per day.  STRENGTH - Supraspinatus  Stand or sit with good posture. Grasp a __________ weight or an exercise band/tubing so that your hand is "thumbs-up," like when you shake hands.  Slowly lift your right / left hand from your thigh into the air, traveling about 30 degrees from straight out at your side. Lift your  hand to shoulder height or as far as you can without increasing any shoulder pain. Initially, many people do not lift their hands above shoulder height.  Avoid shrugging your right / left shoulder as your arm rises by keeping your shoulder blade tucked down and toward your mid-back spine.  Hold for __________ seconds. Control the descent of your hand as you slowly return to your starting position. Repeat __________ times. Complete this exercise __________ times per day.  STRENGTH - Shoulder Extensors  Secure a rubber exercise band/tubing so that it is at the height of your shoulders when you are either standing or sitting on a firm arm-less chair.  With a thumbs-up grip, grasp an end of the band/tubing in  each hand. Straighten your elbows and lift your hands straight in front of you at shoulder height. Step back away from the secured end of band/tubing until it becomes tense.  Squeezing your shoulder blades together, pull your hands down to the sides of your thighs. Do not allow your hands to go behind you.  Hold for __________ seconds. Slowly ease the tension on the band/tubing as you reverse the directions and return to the starting position. Repeat __________ times. Complete this exercise __________ times per day.  STRENGTH - Scapular Retractors  Secure a rubber exercise band/tubing so that it is at the height of your shoulders when you are either standing or sitting on a firm arm-less chair.  With a palm-down grip, grasp an end of the band/tubing in each hand. Straighten your elbows and lift your hands straight in front of you at shoulder height. Step back away from the secured end of band/tubing until it becomes tense.  Squeezing your shoulder blades together, draw your elbows back as you bend them. Keep your upper arm lifted away from your body throughout the exercise.  Hold __________ seconds. Slowly ease the tension on the band/tubing as you reverse the directions and return to the starting position. Repeat __________ times. Complete this exercise __________ times per day. STRENGTH - Scapular Depressors  Find a sturdy chair without wheels, such as a from a dining room table.  Keeping your feet on the floor, lift your bottom from the seat and lock your elbows.  Keeping your elbows straight, allow gravity to pull your body weight down. Your shoulders will rise toward your ears.  Raise your body against gravity by drawing your shoulder blades down your back, shortening the distance between your shoulders and ears. Although your feet should always maintain contact with the floor, your feet should progressively support less body weight as you get stronger.  Hold __________ seconds. In a  controlled and slow manner, lower your body weight to begin the next repetition. Repeat __________ times. Complete this exercise __________ times per day.  Document Released: 05/03/2005 Document Revised: 09/11/2011 Document Reviewed: 10/01/2008 [facility] Patient Information 2015 [place], [place]. This information is not intended to replace advice given to you by your health care provider. Make sure you discuss any questions you have with your health care provider.

## 2014-03-03 NOTE — Progress Notes (Signed)
Subjective:    Ashley Erickson is a 38 y.o. female who presents to [facility] today for Right shoulder pain:  1.  Right shoulder pain:  Present for about 2 months.  Seen at Urgent Care about 6 weeks ago.  No real relief from Ibuprofen or depo-medrol injection provided then.    States pain really started when she changed jobs to work at [employer] middle of July.  2 weeks after injection, was exercising and son heard a pop in her shoulder.  Prior to this, no trauma.  No swelling that she's noted.  Has tried 800 Ibuprofen also without much relief.   2.  Rash:  Still present.  Has history of multiple steroid creams.  History of skin biopsy which was not helpful in past and negative skin KOH.  Still painful at times.    ROS as above per HPI, otherwise neg.   The following portions of the patient's history were reviewed and updated as appropriate: allergies, current medications, past medical history, family and social history, and problem list. Patient is a nonsmoker.   Sign language interpreter used entire visit.    PMH reviewed.  Past Medical History  Diagnosis Date  . Deaf   . Menometrorrhagia 07/17/2011  . Fibroid   . Ulcer "years ago"    Diet controlled, has not needed any medications for this    Past Surgical History  Procedure Laterality Date  . Appendectomy  1994  . Cesarean section  2008  . Myomectomy abdominal approach  2004    Medications reviewed. Current Outpatient Prescriptions  Medication Sig Dispense Refill  . amoxicillin (AMOXIL) 500 MG capsule Take 2 capsules (1,000 mg total) by mouth 2 (two) times daily.  28 capsule  0  . antipyrine-benzocaine (AURALGAN) otic solution Place 3-4 drops into the right ear every 2 (two) hours as needed for ear pain.  10 mL  0  . clarithromycin (BIAXIN) 500 MG tablet Take 1 tablet (500 mg total) by mouth 2 (two) times daily.  28 tablet  0  . desonide (DESOWEN) 0.05 % cream Apply topically 2 (two) times daily.  30 g  0  . fluconazole (DIFLUCAN)  150 MG tablet Take 1 tablet (150 mg total) by mouth every 3 (three) days. After  Completing amoxicillin to prevent yeast infection  2 tablet  0  . fluticasone (FLONASE) 50 MCG/ACT nasal spray Place 2 sprays into both nostrils daily.  16 g  6  . ibuprofen (ADVIL,MOTRIN) 600 MG tablet Take 1 tablet (600 mg total) by mouth every 6 (six) hours as needed.  30 tablet  0  . loratadine (CLARITIN) 10 MG tablet Take 1 tablet (10 mg total) by mouth daily.  30 tablet  2  . omeprazole (PRILOSEC) 20 MG capsule Take 1 capsule (20 mg total) by mouth 2 (two) times daily before a meal.  60 capsule  1  . ondansetron (ZOFRAN) 4 MG tablet Take 1 tablet (4 mg total) by mouth every 8 (eight) hours as needed for nausea. May cause constipation  20 tablet  0  . triamcinolone ointment (KENALOG) 0.1 % Apply topically 2 (two) times daily.  30 g  0   No current facility-administered medications for this visit.     Objective:   Physical Exam BP 129/78  Pulse 99  Temp(Src) 98 F (36.7 C) (Oral)  Ht 5\' 5"  (1.651 m)  Wt 181 lb 9.6 oz (82.373 kg)  BMI 30.22 kg/m2 Gen:  Alert, cooperative patient who appears stated age  in no acute distress.  Vital signs reviewed. MSK:  Left shoulder WNL.   Right shoulder with TTP along anterior aspect.  Difficulty raising shoulder to shoulder height both forward and lateral raise.  Neer negative.  Internal rotation with pain, no pain external rotation.  Empty can positive.   Skin:  Still with erythematous patches scattered across lateral aspect of Left leg both thigh and calf.    No results found for this or any previous visit (from the past 72 hour(s)).   Marland Kitchen

## 2014-03-04 DIAGNOSIS — M7581 Other shoulder lesions, right shoulder: Secondary | ICD-10-CM | POA: Insufficient documentation

## 2014-03-04 NOTE — Assessment & Plan Note (Signed)
Unclear etiology.   Extensive workup for this . Now has Medicaid -- will refer to Derm for further evaluation.

## 2014-03-04 NOTE — Assessment & Plan Note (Signed)
Secondary to overuse +/- acute injury while working out 2 weeks ago. - Continue Ibuprofen about twice a day for anti-inflammatory properties. - Tramadol for severe pain relief. - Strengthening exercises given and reviewed with patient.  - FU with Sports Med for ultrasound of shoulder

## 2014-03-11 ENCOUNTER — Telehealth: Payer: Self-pay | Admitting: Family Medicine

## 2014-03-11 NOTE — Telephone Encounter (Signed)
Called Estill Bamberg back with [facility], they do not provide sign interpreters and needs to know if Leslieann is able to provide her own interpreter for the appointment. I will contact the patient to let her know she must bring an interpreter to her dermatology appointment.Gaile Allmon, Kevin Fenton

## 2014-03-11 NOTE — Telephone Encounter (Signed)
Ashley Erickson from the hand center in [place] called and had some questions about the referral that we sent in. Please call Ashley Erickson at [phone] ext [phone]. jw

## 2014-03-12 ENCOUNTER — Encounter: Payer: Self-pay | Admitting: Sports Medicine

## 2014-03-12 ENCOUNTER — Ambulatory Visit (INDEPENDENT_AMBULATORY_CARE_PROVIDER_SITE_OTHER): Payer: Medicaid Other | Admitting: Sports Medicine

## 2014-03-12 VITALS — BP 116/88 | Ht 65.0 in | Wt 170.0 lb

## 2014-03-12 DIAGNOSIS — M67919 Unspecified disorder of synovium and tendon, unspecified shoulder: Secondary | ICD-10-CM

## 2014-03-12 DIAGNOSIS — M25511 Pain in right shoulder: Secondary | ICD-10-CM

## 2014-03-12 DIAGNOSIS — M25519 Pain in unspecified shoulder: Secondary | ICD-10-CM

## 2014-03-12 DIAGNOSIS — M7521 Bicipital tendinitis, right shoulder: Secondary | ICD-10-CM

## 2014-03-12 DIAGNOSIS — M7520 Bicipital tendinitis, unspecified shoulder: Secondary | ICD-10-CM

## 2014-03-12 DIAGNOSIS — M7581 Other shoulder lesions, right shoulder: Secondary | ICD-10-CM

## 2014-03-12 DIAGNOSIS — M719 Bursopathy, unspecified: Secondary | ICD-10-CM

## 2014-03-12 MED ORDER — NITROGLYCERIN 0.2 MG/HR TD PT24: MEDICATED_PATCH | TRANSDERMAL | Status: DC

## 2014-03-12 NOTE — Assessment & Plan Note (Signed)
Based on the patient's history and physical exam and concern for bicipital tendinitis. Although she likely does  have some rotator cuff tendinitis she did not have any clinical benefit from a subacromial injection and only pain with internal rotation. Also my differential given the location and exam as a possible labral tear however the patient did not have any traumatic injury the likely cause a labral tear given her age a degenerative labral tear is unlikely.  Recommendations: -We'll treat with nitroglycerin protocol over the bicipital tendon -Refer patient for physical therapy to work on shoulder and scapular strengthening and stabilization -Will followup the patient in 6 weeks to reevaluate and repeat ultrasound -Patient may continue when necessary tramadol for additional painful

## 2014-03-12 NOTE — Progress Notes (Signed)
  Ashley Erickson - 38 y.o. female MRN [id number]  Date of birth: Apr 01, 1976  SUBJECTIVE:  Including CC & ROS.  Patient is a 38 year old African American female who presents today for evaluation of right shoulder pain. Patient presented to the visit today with a friend and also an interpreter as the patient is deaf. Through the interpreter patient reports a history of gradually progressive anterior shoulder pain starting towards the end of July beginning of August. 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 Patient has not had any previous injury to this shoulder before. She was seen at an urgent care in August who suspected rotator cuff tendinitis and gave her a subacromial injection patient no clinical improvement from this intervention and thus followed up with her PCPs office. Her PCP who recommended referral to our office for evaluation and ultrasound exam. Patient was also provided with a prescription for tramadol which has not provided any significant pain relief. Patient has not done any physical therapy other than some home exercises given to her by PCP.  Patient localizes her pain over her anterior shoulder particularl over the biceps tendon. Describes a dull aching pain with sharp shooting pains with activities but utilizes muscle and overhead activities. The pain is not waking her up at night. Describes a general soreness. Denies any neck pain, radicular pain, or numbness tingling.    ROS: Review of systems otherwise negative except for information present in HPI  HISTORY: Past Medical, Surgical, Social, and Family History Reviewed & Updated per EMR. Pertinent Historical Findings include: Nonsmoker Hearing impaired History of H. pylori infection with stomach irritation with NSAIDs  DATA REVIEWED: No imaging available  PHYSICAL EXAM:  VS: BP:116/88 mmHg   HR: bpm  TEMP: ( )  RESP:   HT:5\' 5"  (165.1 cm)   WT:170 lb (77.111 kg)  BMI:28.3 SHOULDER EXAM:  General: well nourished Skin of UE: warm; dry, no rashes, lesions, ecchymosis or erythema. Vascular: radial pulses 2+ bilaterally Neurologically: Normal sensation with no sensory or motor defects in C4-C8 bilaterally Palpation: no tenderness over the [facility] S F joint, acromion Significant tenderness over the right bicipital groove ROM active/passive: symmetric full 180 degree of abduction and forward flexion, symmetric internal (80-90) and external rotation (90) with shoulder at 90 abduction. Appley's scratch test equal bilaterally Strength testing: 5/5 symmetric strength in external rotation, forward flexion, adduction, abduction, 4/5 right LE in elbow flexion and IR     Special Test: Negative Neer's, Positive Hawkins, Positive empty can, neg O'Brien due to pain in both positions, positive speeds  MSK [place]: Visualization of the bicipital groove the tendon indicates some possible hypoechoic changes consistent with fluid. Visualization of the subscapularis, infraspinatus, and supraspinatus at the footplate did not reveal any obvious hypoechoic changes.   ASSESSMENT & PLAN: See problem based charting & AVS for pt instructions.

## 2014-03-12 NOTE — Patient Instructions (Signed)

## 2014-04-23 ENCOUNTER — Ambulatory Visit (INDEPENDENT_AMBULATORY_CARE_PROVIDER_SITE_OTHER): Payer: Medicaid Other | Admitting: Sports Medicine

## 2014-04-23 ENCOUNTER — Ambulatory Visit
Admission: RE | Admit: 2014-04-23 | Discharge: 2014-04-23 | Disposition: A | Discharge status: Home / Self Care | Payer: Medicaid Other | Source: Ambulatory Visit | Attending: Sports Medicine | Admitting: Sports Medicine

## 2014-04-23 ENCOUNTER — Encounter: Payer: Self-pay | Admitting: Sports Medicine

## 2014-04-23 VITALS — BP 110/73 | Ht 65.0 in | Wt 170.0 lb

## 2014-04-23 DIAGNOSIS — M7521 Bicipital tendinitis, right shoulder: Secondary | ICD-10-CM

## 2014-04-23 DIAGNOSIS — M7581 Other shoulder lesions, right shoulder: Secondary | ICD-10-CM

## 2014-04-23 MED ORDER — MELOXICAM 15 MG PO TABS: 15.0000 mg | ORAL_TABLET | Freq: Every day | ORAL | Status: DC

## 2014-04-23 MED ORDER — NITROGLYCERIN 0.2 MG/HR TD PT24: MEDICATED_PATCH | TRANSDERMAL | Status: DC

## 2014-04-23 NOTE — Patient Instructions (Signed)
MRI AT [facility] 04/27/14 AT 830A [phone]   XRAYS TODAY

## 2014-04-23 NOTE — Progress Notes (Signed)
Elwyn Reach is the interpreter for this visit

## 2014-04-23 NOTE — Progress Notes (Signed)
  Ashley Erickson - 38 y.o. female MRN [id number]  Date of birth: 1976-05-21    SUBJECTIVE:     Ashley Erickson is a 38 yo F is presenting with f/u for her right shoulder pain. PMH significant for hearing impairment. [employer] interpretor was present for the appointment.   She was seen at Urgent care about three months ago and received a subacromial injection.   She was seen recently at the [facility] and started on nitro patches and referred to PT. She has yet to start PT. She still reports pain in her shoulder. Has been taking ibuprofen 200 mg BID. Reports the pain is 7/10 and localized. It is worse when she has to sign for an extended period of time. 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 There is no pain that wakes her up from sleep.   ROS:     See HPI    OBJECTIVE: BP 110/73  Ht 5\' 5"  (1.651 m)  Wt 170 lb (77.111 kg)  BMI 28.29 kg/m2  Physical Exam:  Vital signs are reviewed. General: signing during exam, Well appearing, NAD ,alert.  Shoulder: Laterality: right Appearance: symmetric, no erythema, ecchymosis  Tenderness: yes  Anterior over BT N/V intact No neck pain and normal neck ROM Range of Motion: Passive abduction: limited due to pain 170 deg Flexion: limited due to pain 170 deg Active abduction:  limited due to pain 90 deg Flexion:  limited due to pain 90 deg, IR  limited due to pain 60 deg, ER  limited due to pain 60 deg Maneuvers: Empty can: pos Internal rotation: normal strength bilaterally  External rotation:normal strength bilaterally  O'Brien's test: pos Speeds: pos  Yergason's: pos Hawkins: pos yes painful arc and no drop arm sign. Strength:  Bicep: 5/5 Grip: 5/5  ASSESSMENT & PLAN:  See problem based charting & AVS for pt instructions.  Great than 40 minutes was spend in face to face coordination of patient care

## 2014-04-23 NOTE — Assessment & Plan Note (Addendum)
Patient with no clinical improve in pain or function since last seen. Has failed conservative management with NSAIDS, nitro patch, and rest. Hasn't participated in PT has directed but plan to make apt. Most likely still related to Bicep tendonitis and possible supraspinates tendonitis. Complaining of more diffuse pain throughout the shoulder now to suggest a rotator cuff syndrome.  - X-rays right shoulder today - Recommend making PT apt soon - continue nitro protocol  - Increase NSAID to Mobic 15mg  daily instead of ibuprofen  - will obtain MRI due to duration of symptoms and no improvement with previous subacrominal injection, rest, NSAIDS and home exercises

## 2014-04-27 ENCOUNTER — Other Ambulatory Visit: Payer: Medicaid Other

## 2014-04-30 ENCOUNTER — Ambulatory Visit
Admission: RE | Admit: 2014-04-30 | Discharge: 2014-04-30 | Disposition: A | Discharge status: Home / Self Care | Payer: Medicaid Other | Source: Ambulatory Visit | Attending: Sports Medicine | Admitting: Sports Medicine

## 2014-04-30 DIAGNOSIS — M7581 Other shoulder lesions, right shoulder: Secondary | ICD-10-CM

## 2014-04-30 DIAGNOSIS — M7521 Bicipital tendinitis, right shoulder: Secondary | ICD-10-CM

## 2014-05-03 ENCOUNTER — Telehealth: Payer: Self-pay | Admitting: Sports Medicine

## 2014-05-03 DIAGNOSIS — M7581 Other shoulder lesions, right shoulder: Secondary | ICD-10-CM

## 2014-05-04 ENCOUNTER — Encounter: Payer: Self-pay | Admitting: Sports Medicine

## 2014-05-05 NOTE — Telephone Encounter (Signed)
Notify patient about the results of MRI showing supraspinatus tendonitis with no rotator cuff tears  Recommendations:  - Continue PT and Nitro Protocol  - Make f/u in 3 weeks

## 2014-05-25 ENCOUNTER — Ambulatory Visit: Payer: Medicaid Other | Admitting: Physical Therapy

## 2014-05-25 ENCOUNTER — Ambulatory Visit: Payer: Medicaid Other | Attending: Family Medicine | Admitting: Physical Therapy

## 2014-06-12 ENCOUNTER — Ambulatory Visit (INDEPENDENT_AMBULATORY_CARE_PROVIDER_SITE_OTHER): Payer: Medicaid Other | Admitting: Family Medicine

## 2014-06-12 ENCOUNTER — Encounter: Payer: Self-pay | Admitting: Family Medicine

## 2014-06-12 VITALS — BP 132/63 | HR 88 | Ht 65.0 in | Wt 180.5 lb

## 2014-06-12 DIAGNOSIS — L280 Lichen simplex chronicus: Secondary | ICD-10-CM

## 2014-06-12 DIAGNOSIS — L298 Other pruritus: Secondary | ICD-10-CM

## 2014-06-12 DIAGNOSIS — N898 Other specified noninflammatory disorders of vagina: Secondary | ICD-10-CM

## 2014-06-12 MED ORDER — CLOBETASOL PROP EMOLLIENT BASE 0.05 % EX CREA: 1.0000 | TOPICAL_CREAM | Freq: Two times a day (BID) | CUTANEOUS | Status: AC

## 2014-06-12 MED ORDER — FLUCONAZOLE 150 MG PO TABS: 150.0000 mg | ORAL_TABLET | Freq: Every day | ORAL | Status: DC

## 2014-06-12 NOTE — Assessment & Plan Note (Addendum)
She reports similar problem previously that improved with medication.  Will trial Diflucan and temovate, use 2x/daily until stops itching, then use 1x/wk. If no improvement return for biopsy.  Exam is not clearly diagnostic of lichen sclerosis and she is not the right age, but exam is not normal.

## 2014-06-12 NOTE — Patient Instructions (Signed)
Lichen Sclerosus Lichen sclerosus is a skin problem. It can happen on any part of the body, but it commonly involves the anal or genital areas. Lichen sclerosus is not an infection or a fungus. Girls and women are more commonly affected than boys and men. CAUSES The cause is not known. It could be the result of an overactive immune system or a lack of certain hormones. Lichen sclerosus is not passed from one person to another (not contagious). SYMPTOMS Your skin may have:  Thin, wrinkled, white areas.  Thickened white areas.  Red and swollen patches.  Tears or cracks.  Bruising.  Blood blisters.  Severe itching. You may also have pain, itching, or burning with urination. Constipation is also common in people with lichen sclerosus. DIAGNOSIS Your caregiver will do a physical exam. Sometimes, a tissue sample (biopsy) may be sent for testing. TREATMENT Treatment may involve putting a thin layer of medicated cream (topical steroid) over the areas with lichen sclerosus. Use the cream only as directed by your caregiver.  HOME CARE INSTRUCTIONS  Only take over-the-counter or prescription medicines as directed by your caregiver.  Keep the vaginal area as clean and dry as possible. SEEK MEDICAL CARE IF: You develop increasing pain, swelling, or redness. Document Released: 11/09/2010 Document Revised: 09/11/2011 Document Reviewed: 11/09/2010 ExitCare Patient Information 2015 [employer]. This information is not intended to replace advice given to you by your health care provider. Make sure you discuss any questions you have with your health care provider.  

## 2014-06-12 NOTE — Addendum Note (Signed)
Addended by: Donnamae Jude on: 06/12/2014 10:17 AM   Modules accepted: Orders

## 2014-06-12 NOTE — Progress Notes (Addendum)
    Subjective:  Patient is hearing impaired.  Sign language interpreter used.   Patient ID: Ashley Erickson is a 38 y.o. female presenting with Vaginal Itching  on 06/12/2014  HPI: Vaginal itching x 1 month. Has had similar episode a couple of years ago.  Medicine treated before, cannot find this visit in her chart.  No vaginal discharge. Denies fever/chills/nausea/vomiting. Takes showers only.  Review of Systems  Constitutional: Negative for fever and chills.  Respiratory: Negative for shortness of breath.   Cardiovascular: Negative for chest pain.  Gastrointestinal: Negative for nausea, vomiting and abdominal pain.  Genitourinary: Negative for dysuria.  Skin: Negative for rash.      Objective:    BP 132/63 mmHg  Pulse 88  Ht 5\' 5"  (1.651 m)  Wt 180 lb 8 oz (81.874 kg)  BMI 30.04 kg/m2  LMP 06/07/2014 (Exact Date) Physical Exam  Constitutional: She is oriented to person, place, and time. She appears well-developed and well-nourished. No distress.  HENT:  Head: Normocephalic and atraumatic.  Eyes: No scleral icterus.  Neck: Neck supple.  Cardiovascular: Normal rate.   Pulmonary/Chest: Effort normal.  Abdominal: Soft.  Genitourinary:  Pallor noted at upper introitus circumferentially. Bloody discharge noted, but at end of cycle. No obvious yeast noted.  Neurological: She is alert and oriented to person, place, and time.  Skin: Skin is warm and dry.  Psychiatric: She has a normal mood and affect.        Assessment & Plan:   Problem List Items Addressed This Visit      Unprioritized   Lichen simplex chronicus - Primary    She reports similar problem previously that improved with medication.  Will trial Diflucan and temovate, use 2x/daily until stops itching, then use 1x/wk. If no improvement return for biopsy.  Exam is not clearly diagnostic of lichen sclerosis and she is not the right age, but exam is not normal.    Relevant Medications      Clobetasol Prop  Emollient Base 0.05 % emollient cream      fluconazole (DIFLUCAN) tablet 150 mg   Vaginal itching   Relevant Medications      Clobetasol Prop Emollient Base 0.05 % emollient cream      fluconazole (DIFLUCAN) tablet 150 mg   Other Relevant Orders      Wet prep, genital        Return if symptoms worsen or fail to improve.

## 2014-06-12 NOTE — Progress Notes (Signed)
Patient reports vulvar itching and some vaginal itching as well.

## 2014-06-13 LAB — WET PREP, GENITAL
Clue Cells Wet Prep HPF POC: NONE SEEN
Trich, Wet Prep: NONE SEEN
Yeast Wet Prep HPF POC: NONE SEEN

## 2014-08-01 ENCOUNTER — Encounter (HOSPITAL_COMMUNITY): Payer: Self-pay | Admitting: *Deleted

## 2014-08-01 ENCOUNTER — Emergency Department (INDEPENDENT_AMBULATORY_CARE_PROVIDER_SITE_OTHER)
Admission: EM | Admit: 2014-08-01 | Discharge: 2014-08-01 | Disposition: A | Discharge status: Home / Self Care | Payer: Medicaid Other | Source: Home / Self Care | Attending: Emergency Medicine | Admitting: Emergency Medicine

## 2014-08-01 DIAGNOSIS — R0981 Nasal congestion: Secondary | ICD-10-CM | POA: Diagnosis not present

## 2014-08-01 DIAGNOSIS — J069 Acute upper respiratory infection, unspecified: Secondary | ICD-10-CM | POA: Diagnosis not present

## 2014-08-01 DIAGNOSIS — R05 Cough: Secondary | ICD-10-CM

## 2014-08-01 DIAGNOSIS — R059 Cough, unspecified: Secondary | ICD-10-CM

## 2014-08-01 LAB — POCT RAPID STREP A: Streptococcus, Group A Screen (Direct): NEGATIVE

## 2014-08-01 MED ORDER — HYDROCODONE-HOMATROPINE 5-1.5 MG/5ML PO SYRP: 5.0000 mL | ORAL_SOLUTION | Freq: Four times a day (QID) | ORAL | Status: DC | PRN

## 2014-08-01 MED ORDER — METHYLPREDNISOLONE ACETATE 80 MG/ML IJ SUSP
80.0000 mg | Freq: Once | INTRAMUSCULAR | Status: AC
  Administered 2014-08-01: 80 mg via INTRAMUSCULAR

## 2014-08-01 MED ORDER — METHYLPREDNISOLONE ACETATE 80 MG/ML IJ SUSP
INTRAMUSCULAR | Status: AC
  Filled 2014-08-01: qty 1

## 2014-08-01 MED ORDER — IBUPROFEN 800 MG PO TABS
800.0000 mg | ORAL_TABLET | Freq: Once | ORAL | Status: AC
  Administered 2014-08-01: 800 mg via ORAL

## 2014-08-01 MED ORDER — IBUPROFEN 800 MG PO TABS
ORAL_TABLET | ORAL | Status: AC
  Filled 2014-08-01: qty 1

## 2014-08-01 NOTE — ED Notes (Signed)
Via hearing interpreter: C/O runny nose, sore throat, bilat earache, weakness and fatigue.  Has felt feverish.  C/O difficulty breathing at night due to congestion.Has been taking combo meds with acetaminophen, dextromethorphan, phenylephrine, and diphenhydramine without relief.

## 2014-08-01 NOTE — ED Provider Notes (Signed)
CSN: [id number]     Arrival date & time 08/01/14  1556 History   First MD Initiated Contact with Patient 08/01/14 1738     Chief Complaint  Patient presents with  . Sore Throat  . Otalgia   (Consider location/radiation/quality/duration/timing/severity/associated sxs/prior Treatment) HPI Comments: Via Clinical cytogeneticist. Patient complaints of 4-5 days of rhinorrhea, post nasal drip, cough (non-productive), allergy sneezing and itchy painful throat. Overall fatigued with malaise. No fever or chills. Not sleeping well and using OTC medications without good relief.   Patient is a 39 y.o. female presenting with pharyngitis and ear pain. The history is provided by the patient. A language interpreter was used.  Sore Throat  Otalgia Associated symptoms: cough, rhinorrhea and sore throat     Past Medical History  Diagnosis Date  . Deaf   . Menometrorrhagia 07/17/2011  . Fibroid   . Ulcer "years ago"    Diet controlled, has not needed any medications for this    Past Surgical History  Procedure Laterality Date  . Appendectomy  1994  . Cesarean section  2008  . Myomectomy abdominal approach  2004   Family History  Problem Relation Age of Onset  . Seizures Sister   . Anesthesia problems Neg Hx    History  Substance Use Topics  . Smoking status: Never Smoker   . Smokeless tobacco: Not on file  . Alcohol Use: Not on file   OB History    Gravida Para Term Preterm AB TAB SAB Ectopic Multiple Living   2 2 2  0 0 0 0 0 0 2     Review of Systems  HENT: Positive for ear pain, rhinorrhea, sinus pressure, sneezing and sore throat.   Respiratory: Positive for cough. Negative for wheezing.   Skin: Negative.   Psychiatric/Behavioral: Negative.     Allergies  Ibuprofen  Home Medications   Prior to Admission medications   Medication Sig Start Date End Date Taking? Authorizing Provider  Clobetasol Prop Emollient Base 0.05 % emollient cream Apply 1 Dose topically 2 (two) times daily.  06/12/14   Donnamae Jude, MD  fluconazole (DIFLUCAN) 150 MG tablet Take 1 tablet (150 mg total) by mouth daily. Repeat in 24 hours if needed 06/12/14   Donnamae Jude, MD  HYDROcodone-homatropine [facility]) 5-1.5 MG/5ML syrup Take 5 mLs by mouth every 6 (six) hours as needed for cough (and congestion). 08/01/14   Vanessa Manchester Young, PA-C   BP 132/91 mmHg  Pulse 74  Temp(Src) 98.6 F (37 C) (Oral)  Resp 16  SpO2 100%  LMP 07/29/2014 (Exact Date) Physical Exam  Constitutional: She is oriented to person, place, and time. She appears well-developed and well-nourished. No distress.  HENT:  Head: Normocephalic and atraumatic.  Right Ear: External ear normal.  Left Ear: External ear normal.  Mouth/Throat: Oropharynx is clear and moist.  Bilateral turbinates erythematous and swollen  Eyes: Pupils are equal, round, and reactive to light.  Neck: Normal range of motion.  Cardiovascular: Normal rate and regular rhythm.   Pulmonary/Chest: Effort normal and breath sounds normal. No respiratory distress. She has no wheezes.  Lymphadenopathy:    She has no cervical adenopathy.  Neurological: She is alert and oriented to person, place, and time. No cranial nerve deficit. Coordination normal.  Skin: Skin is warm and dry. She is not diaphoretic.  Psychiatric: Her behavior is normal.  Nursing note and vitals reviewed.   ED Course  Procedures (including critical care time) Labs Review Labs Reviewed  POCT  RAPID STREP A ([facility]RG CARE ONLY)    Imaging Review No results found.   MDM   1. URI (upper respiratory infection)   2. Cough   3. Sinus congestion    No indication for an antibiotic. Did off a DepoMedrol injection in the setting of viral sinusitis and overall malaise. Continue with OTC care and add Hycodan as needed for cough/congestion at night time.    Bjorn Pippin, PA-C 08/01/14 1820

## 2014-08-01 NOTE — ED Notes (Signed)
Pt states she can take IBU; crackers given.

## 2014-08-01 NOTE — ED Notes (Signed)
Pt waiting for interperter

## 2014-08-01 NOTE — Discharge Instructions (Signed)
Cough, Adult ° A cough is a reflex. It helps you clear your throat and airways. A cough can help heal your body. A cough can last 2 or 3 weeks (acute) or may last more than 8 weeks (chronic). Some common causes of a cough can include an infection, allergy, or a cold. °HOME CARE °· Only take medicine as told by your doctor. °· If given, take your medicines (antibiotics) as told. Finish them even if you start to feel better. °· Use a cold steam vaporizer or humidifier in your home. This can help loosen thick spit (secretions). °· Sleep so you are almost sitting up (semi-upright). Use pillows to do this. This helps reduce coughing. °· Rest as needed. °· Stop smoking if you smoke. °GET HELP RIGHT AWAY IF: °· You have yellowish-white fluid (pus) in your thick spit. °· Your cough gets worse. °· Your medicine does not reduce coughing, and you are losing sleep. °· You cough up blood. °· You have trouble breathing. °· Your pain gets worse and medicine does not help. °· You have a fever. °MAKE SURE YOU:  °· Understand these instructions. °· Will watch your condition. °· Will get help right away if you are not doing well or get worse. °Document Released: 03/02/2011 Document Revised: 11/03/2013 Document Reviewed: 03/02/2011 °ExitCare® Patient Information ©2015 [employer]. This information is not intended to replace advice given to you by your health care provider. Make sure you discuss any questions you have with your health care provider. ° °Upper Respiratory Infection, Adult °An upper respiratory infection (URI) is also sometimes known as the common cold. The upper respiratory tract includes the nose, sinuses, throat, trachea, and bronchi. Bronchi are the airways leading to the lungs. Most people improve within 1 week, but symptoms can last up to 2 weeks. A residual cough may last even longer.  °CAUSES °Many different viruses can infect the tissues lining the upper respiratory tract. The tissues become irritated and  inflamed and often become very moist. Mucus production is also common. A cold is contagious. You can easily spread the virus to others by oral contact. This includes kissing, sharing a glass, coughing, or sneezing. Touching your mouth or nose and then touching a surface, which is then touched by another person, can also spread the virus. °SYMPTOMS  °Symptoms typically develop 1 to 3 days after you come in contact with a cold virus. Symptoms vary from person to person. They may include: °· Runny nose. °· Sneezing. °· Nasal congestion. °· Sinus irritation. °· Sore throat. °· Loss of voice (laryngitis). °· Cough. °· Fatigue. °· Muscle aches. °· Loss of appetite. °· Headache. °· Low-grade fever. °DIAGNOSIS  °You might diagnose your own cold based on familiar symptoms, since most people get a cold 2 to 3 times a year. Your caregiver can confirm this based on your exam. Most importantly, your caregiver can check that your symptoms are not due to another disease such as strep throat, sinusitis, pneumonia, asthma, or epiglottitis. Blood tests, throat tests, and X-rays are not necessary to diagnose a common cold, but they may sometimes be helpful in excluding other more serious diseases. Your caregiver will decide if any further tests are required. °RISKS AND COMPLICATIONS  °You may be at risk for a more severe case of the common cold if you smoke cigarettes, have chronic heart disease (such as heart failure) or lung disease (such as asthma), or if you have a weakened immune system. The very Ashley Erickson and very old are   also at risk for more serious infections. Bacterial sinusitis, middle ear infections, and bacterial pneumonia can complicate the common cold. The common cold can worsen asthma and chronic obstructive pulmonary disease (COPD). Sometimes, these complications can require emergency medical care and may be life-threatening. PREVENTION  The best way to protect against getting a cold is to practice good hygiene. Avoid  oral or hand contact with people with cold symptoms. Wash your hands often if contact occurs. There is no clear evidence that vitamin C, vitamin E, echinacea, or exercise reduces the chance of developing a cold. However, it is always recommended to get plenty of rest and practice good nutrition. TREATMENT  Treatment is directed at relieving symptoms. There is no cure. Antibiotics are not effective, because the infection is caused by a virus, not by bacteria. Treatment may include:  Increased fluid intake. Sports drinks offer valuable electrolytes, sugars, and fluids.  Breathing heated mist or steam (vaporizer or shower).  Eating chicken soup or other clear broths, and maintaining good nutrition.  Getting plenty of rest.  Using gargles or lozenges for comfort.  Controlling fevers with ibuprofen or acetaminophen as directed by your caregiver.  Increasing usage of your inhaler if you have asthma. Zinc gel and zinc lozenges, taken in the first 24 hours of the common cold, can shorten the duration and lessen the severity of symptoms. Pain medicines may help with fever, muscle aches, and throat pain. A variety of non-prescription medicines are available to treat congestion and runny nose. Your caregiver can make recommendations and may suggest nasal or lung inhalers for other symptoms.  HOME CARE INSTRUCTIONS   Only take over-the-counter or prescription medicines for pain, discomfort, or fever as directed by your caregiver.  Use a warm mist humidifier or inhale steam from a shower to increase air moisture. This may keep secretions moist and make it easier to breathe.  Drink enough water and fluids to keep your urine clear or pale yellow.  Rest as needed.  Return to work when your temperature has returned to normal or as your caregiver advises. You may need to stay home longer to avoid infecting others. You can also use a face mask and careful hand washing to prevent spread of the virus. SEEK  MEDICAL CARE IF:   After the first few days, you feel you are getting worse rather than better.  You need your caregiver's advice about medicines to control symptoms.  You develop chills, worsening shortness of breath, or brown or red sputum. These may be signs of pneumonia.  You develop yellow or brown nasal discharge or pain in the face, especially when you bend forward. These may be signs of sinusitis.  You develop a fever, swollen neck glands, pain with swallowing, or white areas in the back of your throat. These may be signs of strep throat. SEEK IMMEDIATE MEDICAL CARE IF:   You have a fever.  You develop severe or persistent headache, ear pain, sinus pain, or chest pain.  You develop wheezing, a prolonged cough, cough up blood, or have a change in your usual mucus (if you have chronic lung disease).  You develop sore muscles or a stiff neck. Document Released: 12/13/2000 Document Revised: 09/11/2011 Document Reviewed: 09/24/2013 [facility] Patient Information 2015 [place], [place]. This information is not intended to replace advice given to you by your health care provider. Make sure you discuss any questions you have with your health care provider.   Use the liquid cough medication as needed-Will make you sleepy.  Ok to take your OTC medication as well. Drink  Plenty of fluids and get good rest. Steroid shot will help with overall malaise.

## 2014-08-03 LAB — CULTURE, GROUP A STREP

## 2014-08-28 ENCOUNTER — Encounter: Payer: Medicaid Other | Admitting: Family Medicine

## 2014-09-03 ENCOUNTER — Encounter (HOSPITAL_COMMUNITY): Payer: Self-pay | Admitting: *Deleted

## 2014-09-03 ENCOUNTER — Emergency Department (INDEPENDENT_AMBULATORY_CARE_PROVIDER_SITE_OTHER)
Admission: EM | Admit: 2014-09-03 | Discharge: 2014-09-03 | Disposition: A | Discharge status: Home / Self Care | Payer: Medicaid Other | Source: Home / Self Care

## 2014-09-03 DIAGNOSIS — B349 Viral infection, unspecified: Secondary | ICD-10-CM

## 2014-09-03 DIAGNOSIS — J069 Acute upper respiratory infection, unspecified: Secondary | ICD-10-CM

## 2014-09-03 DIAGNOSIS — H6502 Acute serous otitis media, left ear: Secondary | ICD-10-CM

## 2014-09-03 MED ORDER — IPRATROPIUM BROMIDE 0.06 % NA SOLN: 2.0000 | Freq: Four times a day (QID) | NASAL | Status: DC

## 2014-09-03 NOTE — Discharge Instructions (Signed)
Upper Respiratory Infection, Adult Lots of nasal saline Dayquil and Allegra or Zyrtec Lo of fluids. An upper respiratory infection (URI) is also sometimes known as the common cold. The upper respiratory tract includes the nose, sinuses, throat, trachea, and bronchi. Bronchi are the airways leading to the lungs. Most people improve within 1 week, but symptoms can last up to 2 weeks. A residual cough may last even longer.  CAUSES Many different viruses can infect the tissues lining the upper respiratory tract. The tissues become irritated and inflamed and often become very moist. Mucus production is also common. A cold is contagious. You can easily spread the virus to others by oral contact. This includes kissing, sharing a glass, coughing, or sneezing. Touching your mouth or nose and then touching a surface, which is then touched by another person, can also spread the virus. SYMPTOMS  Symptoms typically develop 1 to 3 days after you come in contact with a cold virus. Symptoms vary from person to person. They may include:  Runny nose.  Sneezing.  Nasal congestion.  Sinus irritation.  Sore throat.  Loss of voice (laryngitis).  Cough.  Fatigue.  Muscle aches.  Loss of appetite.  Headache.  Low-grade fever. DIAGNOSIS  You might diagnose your own cold based on familiar symptoms, since most people get a cold 2 to 3 times a year. Your caregiver can confirm this based on your exam. Most importantly, your caregiver can check that your symptoms are not due to another disease such as strep throat, sinusitis, pneumonia, asthma, or epiglottitis. Blood tests, throat tests, and X-rays are not necessary to diagnose a common cold, but they may sometimes be helpful in excluding other more serious diseases. Your caregiver will decide if any further tests are required. RISKS AND COMPLICATIONS  You may be at risk for a more severe case of the common cold if you smoke cigarettes, have chronic heart  disease (such as heart failure) or lung disease (such as asthma), or if you have a weakened immune system. The very young and very old are also at risk for more serious infections. Bacterial sinusitis, middle ear infections, and bacterial pneumonia can complicate the common cold. The common cold can worsen asthma and chronic obstructive pulmonary disease (COPD). Sometimes, these complications can require emergency medical care and may be life-threatening. PREVENTION  The best way to protect against getting a cold is to practice good hygiene. Avoid oral or hand contact with people with cold symptoms. Wash your hands often if contact occurs. There is no clear evidence that vitamin C, vitamin E, echinacea, or exercise reduces the chance of developing a cold. However, it is always recommended to get plenty of rest and practice good nutrition. TREATMENT  Treatment is directed at relieving symptoms. There is no cure. Antibiotics are not effective, because the infection is caused by a virus, not by bacteria. Treatment may include:  Increased fluid intake. Sports drinks offer valuable electrolytes, sugars, and fluids.  Breathing heated mist or steam (vaporizer or shower).  Eating chicken soup or other clear broths, and maintaining good nutrition.  Getting plenty of rest.  Using gargles or lozenges for comfort.  Controlling fevers with ibuprofen or acetaminophen as directed by your caregiver.  Increasing usage of your inhaler if you have asthma. Zinc gel and zinc lozenges, taken in the first 24 hours of the common cold, can shorten the duration and lessen the severity of symptoms. Pain medicines may help with fever, muscle aches, and throat pain. A variety of  non-prescription medicines are available to treat congestion and runny nose. Your caregiver can make recommendations and may suggest nasal or lung inhalers for other symptoms.  HOME CARE INSTRUCTIONS   Only take over-the-counter or prescription  medicines for pain, discomfort, or fever as directed by your caregiver.  Use a warm mist humidifier or inhale steam from a shower to increase air moisture. This may keep secretions moist and make it easier to breathe.  Drink enough water and fluids to keep your urine clear or pale yellow.  Rest as needed.  Return to work when your temperature has returned to normal or as your caregiver advises. You may need to stay home longer to avoid infecting others. You can also use a face mask and careful hand washing to prevent spread of the virus. SEEK MEDICAL CARE IF:   After the first few days, you feel you are getting worse rather than better.  You need your caregiver's advice about medicines to control symptoms.  You develop chills, worsening shortness of breath, or brown or red sputum. These may be signs of pneumonia.  You develop yellow or brown nasal discharge or pain in the face, especially when you bend forward. These may be signs of sinusitis.  You develop a fever, swollen neck glands, pain with swallowing, or white areas in the back of your throat. These may be signs of strep throat. SEEK IMMEDIATE MEDICAL CARE IF:   You have a fever.  You develop severe or persistent headache, ear pain, sinus pain, or chest pain.  You develop wheezing, a prolonged cough, cough up blood, or have a change in your usual mucus (if you have chronic lung disease).  You develop sore muscles or a stiff neck. Document Released: 12/13/2000 Document Revised: 09/11/2011 Document Reviewed: 09/24/2013 [facility] Patient Information 2015 [place], [place]. This information is not intended to replace advice given to you by your health care provider. Make sure you discuss any questions you have with your health care provider.  Serous Otitis Media Serous otitis media is fluid in the middle ear space. This space contains the bones for hearing and air. Air in the middle ear space helps to transmit sound.  The air gets  there through the eustachian tube. This tube goes from the back of the nose (nasopharynx) to the middle ear space. It keeps the pressure in the middle ear the same as the outside world. It also helps to drain fluid from the middle ear space. CAUSES  Serous otitis media occurs when the eustachian tube gets blocked. Blockage can come from:  Ear infections.  Colds and other upper respiratory infections.  Allergies.  Irritants such as cigarette smoke.  Sudden changes in air pressure (such as descending in an airplane).  Enlarged adenoids.  A mass in the nasopharynx. During colds and upper respiratory infections, the middle ear space can become temporarily filled with fluid. This can happen after an ear infection also. Once the infection clears, the fluid will generally drain out of the ear through the eustachian tube. If it does not, then serous otitis media occurs. SIGNS AND SYMPTOMS   Hearing loss.  A feeling of fullness in the ear, without pain.  Young children may not show any symptoms but may show slight behavioral changes, such as agitation, ear pulling, or crying. DIAGNOSIS  Serous otitis media is diagnosed by an ear exam. Tests may be done to check on the movement of the eardrum. Hearing exams may also be done. TREATMENT  The fluid most  often goes away without treatment. If allergy is the cause, allergy treatment may be helpful. Fluid that persists for several months may require minor surgery. A small tube is placed in the eardrum to:  Drain the fluid.  Restore the air in the middle ear space. In certain situations, antibiotic medicines are used to avoid surgery. Surgery may be done to remove enlarged adenoids (if this is the cause). HOME CARE INSTRUCTIONS   Keep children away from tobacco smoke.  Keep all follow-up visits as directed by your health care provider. SEEK MEDICAL CARE IF:   Your hearing is not better in 3 months.  Your hearing is worse.  You have ear  pain.  You have drainage from the ear.  You have dizziness.  You have serous otitis media only in one ear or have any bleeding from your nose (epistaxis).  You notice a lump on your neck. MAKE SURE YOU:  Understand these instructions.   Will watch your condition.   Will get help right away if you are not doing well or get worse.  Document Released: 09/09/2003 Document Revised: 11/03/2013 Document Reviewed: 01/14/2013 [facility] Patient Information 2015 [place], [place]. This information is not intended to replace advice given to you by your health care provider. Make sure you discuss any questions you have with your health care provider.  Viral Infections A viral infection can be caused by different types of viruses.Most viral infections are not serious and resolve on their own. However, some infections may cause severe symptoms and may lead to further complications. SYMPTOMS Viruses can frequently cause:  Minor sore throat.  Aches and pains.  Headaches.  Runny nose.  Different types of rashes.  Watery eyes.  Tiredness.  Cough.  Loss of appetite.  Gastrointestinal infections, resulting in nausea, vomiting, and diarrhea. These symptoms do not respond to antibiotics because the infection is not caused by bacteria. However, you might catch a bacterial infection following the viral infection. This is sometimes called a "superinfection." Symptoms of such a bacterial infection may include:  Worsening sore throat with pus and difficulty swallowing.  Swollen neck glands.  Chills and a high or persistent fever.  Severe headache.  Tenderness over the sinuses.  Persistent overall ill feeling (malaise), muscle aches, and tiredness (fatigue).  Persistent cough.  Yellow, green, or brown mucus production with coughing. HOME CARE INSTRUCTIONS   Only take over-the-counter or prescription medicines for pain, discomfort, diarrhea, or fever as directed by your  caregiver.  Drink enough water and fluids to keep your urine clear or pale yellow. Sports drinks can provide valuable electrolytes, sugars, and hydration.  Get plenty of rest and maintain proper nutrition. Soups and broths with crackers or rice are fine. SEEK IMMEDIATE MEDICAL CARE IF:   You have severe headaches, shortness of breath, chest pain, neck pain, or an unusual rash.  You have uncontrolled vomiting, diarrhea, or you are unable to keep down fluids.  You or your child has an oral temperature above 102 F (38.9 C), not controlled by medicine.  Your baby is older than 3 months with a rectal temperature of 102 F (38.9 C) or higher.  Your baby is 30 months old or younger with a rectal temperature of 100.4 F (38 C) or higher. MAKE SURE YOU:   Understand these instructions.  Will watch your condition.  Will get help right away if you are not doing well or get worse. Document Released: 03/29/2005 Document Revised: 09/11/2011 Document Reviewed: 10/24/2010 [employer] Patient Information 2015 [place]. This information is not intended to replace advice given to you by your health care provider. Make sure you discuss any questions you have with your health care provider.

## 2014-09-03 NOTE — ED Provider Notes (Signed)
CSN: [id number]     Arrival date & time 09/03/14  1626 History   None    Chief Complaint  Patient presents with  . Sore Throat  . 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 She is complaining of sore throat, PND, earaches, body aches, weakness but no fever.   Past Medical History  Diagnosis Date  . Deaf   . Menometrorrhagia 07/17/2011  . Fibroid   . Ulcer "years ago"    Diet controlled, has not needed any medications for this    Past Surgical History  Procedure Laterality Date  . Appendectomy  1994  . Cesarean section  2008  . Myomectomy abdominal approach  2004   Family History  Problem Relation Age of Onset  . Seizures Sister   . Anesthesia problems Neg Hx    History  Substance Use Topics  . Smoking status: Never Smoker   . Smokeless tobacco: Not on file  . Alcohol Use: No   OB History    Gravida Para Term Preterm AB TAB SAB Ectopic Multiple Living   2 2 2  0 0 0 0 0 0 2     Review of Systems  Constitutional: Positive for activity change and fatigue. Negative for fever.  HENT: Positive for congestion, postnasal drip and sore throat.   Eyes: Negative.   Respiratory: Positive for cough. Negative for shortness of breath.   Cardiovascular: Positive for chest pain.  Genitourinary: Negative.   Skin: Negative.   Neurological: Negative.     Allergies  Ibuprofen  Home Medications   Prior to Admission medications   Medication Sig Start Date End Date Taking? Authorizing Provider  HYDROcodone-homatropine (HYCODAN) 5-1.5 MG/5ML syrup Take 5 mLs by mouth every 6 (six) hours as needed for cough (and congestion). 08/01/14  Yes Bjorn Pippin, PA-C  Clobetasol Prop Emollient Base 0.05 % emollient cream Apply 1 Dose topically 2 (two) times daily. 06/12/14   Donnamae Jude, MD  fluconazole  (DIFLUCAN) 150 MG tablet Take 1 tablet (150 mg total) by mouth daily. Repeat in 24 hours if needed 06/12/14   Donnamae Jude, MD  ipratropium (ATROVENT) 0.06 % nasal spray Place 2 sprays into both nostrils 4 (four) times daily. 09/03/14   Janne Napoleon, NP   BP 131/82 mmHg  Pulse 75  Temp(Src) 98 F (36.7 C) (Oral)  Resp 14  SpO2 100%  LMP 09/01/2014 Physical Exam  Constitutional: She is oriented to person, place, and time. She appears well-developed. No distress.  HENT:  Left TM is normal. Right TM with mild effusion but no erythema. Oropharynx is pink, no erythema but positive for clear PND.  Eyes: Conjunctivae and EOM are normal.  Neck: Normal range of motion. Neck supple.  Cardiovascular: Normal rate, regular rhythm, normal heart sounds and intact distal pulses.   Pulmonary/Chest: Effort normal and breath sounds normal. No respiratory distress. She has no wheezes. She has no rales.  Musculoskeletal: She exhibits no edema or tenderness.  Lymphadenopathy:    She has no cervical adenopathy.  Neurological: She is alert and oriented to person, place, and time.  Skin: Skin is warm and dry.  Psychiatric: She has a normal mood and affect.  Nursing note and vitals reviewed.   ED Course  Procedures (including critical care time) Labs Review Labs Reviewed - No data to display  Imaging Review No results found.   MDM  1. URI (upper respiratory infection)   2. Viral syndrome   3. Acute serous otitis media of left ear, recurrence not specified    Lots of nasal saline Dayquil and Allegra or Zyrtec Lots of fluids. atrovent nasal spray     Janne Napoleon, NP 09/03/14 1851

## 2014-09-03 NOTE — ED Notes (Addendum)
C/o sore throat, earache and stuffy nose.  Stomach is weak but not vomiting. Signing to PA.  C/o chest pain after deep breathing for NP.  Has pain on palpation.

## 2014-09-03 NOTE — ED Notes (Signed)
I called hospital operator for numbers for hearing impaired.  I called and spoke to [place]

## 2014-09-03 NOTE — ED Notes (Signed)
Interpreter here. NP notified.

## 2014-09-09 ENCOUNTER — Emergency Department (HOSPITAL_COMMUNITY)
Admission: EM | Admit: 2014-09-09 | Discharge: 2014-09-09 | Disposition: A | Discharge status: Home / Self Care | Payer: Medicaid Other | Source: Home / Self Care

## 2014-09-09 ENCOUNTER — Encounter (HOSPITAL_COMMUNITY): Payer: Self-pay | Admitting: *Deleted

## 2014-09-09 DIAGNOSIS — H6593 Unspecified nonsuppurative otitis media, bilateral: Secondary | ICD-10-CM

## 2014-09-09 DIAGNOSIS — J309 Allergic rhinitis, unspecified: Secondary | ICD-10-CM

## 2014-09-09 MED ORDER — CETIRIZINE HCL 10 MG PO TABS: 20.0000 mg | ORAL_TABLET | Freq: Every day | ORAL | Status: AC

## 2014-09-09 NOTE — ED Notes (Signed)
Pt is deaf. Son, Delrae Alfred, is translating. Pt is here with complaints of bilat eye pain and itching after attempting to apply false lashes. Pt also reports green sputum.

## 2014-09-09 NOTE — Discharge Instructions (Signed)
You may use an over the counter allergy eye drop for relief of itching.  I recommend ZATIDOR (Ketotifen).  You can also purchase 100 ZYRTEC tablets without a prescription at any pharmacy or at the [facility] around the corner for $7.03 including tax. Continue to use the prescribed nasal spray along with ZYRTEC AND the ZATIDOR eye drops for relief of congestion and itching.   Allergic Rhinitis Allergic rhinitis is when the mucous membranes in the nose respond to allergens. Allergens are particles in the air that cause your body to have an allergic reaction. This causes you to release allergic antibodies. Through a chain of events, these eventually cause you to release histamine into the blood stream. Although meant to protect the body, it is this release of histamine that causes your discomfort, such as frequent sneezing, congestion, and an itchy, runny nose.  CAUSES  Seasonal allergic rhinitis (hay fever) is caused by pollen allergens that may come from grasses, trees, and weeds. Year-round allergic rhinitis (perennial allergic rhinitis) is caused by allergens such as house dust mites, pet dander, and mold spores.  SYMPTOMS   Nasal stuffiness (congestion).  Itchy, runny nose with sneezing and tearing of the eyes. DIAGNOSIS  Your health care provider can help you determine the allergen or allergens that trigger your symptoms. If you and your health care provider are unable to determine the allergen, skin or blood testing may be used. TREATMENT  Allergic rhinitis does not have a cure, but it can be controlled by:  Medicines and allergy shots (immunotherapy).  Avoiding the allergen. Hay fever may often be treated with antihistamines in pill or nasal spray forms. Antihistamines block the effects of histamine. There are over-the-counter medicines that may help with nasal congestion and swelling around the eyes. Check with your health care provider before taking or giving this medicine.    If avoiding the allergen or the medicine prescribed do not work, there are many new medicines your health care provider can prescribe. Stronger medicine may be used if initial measures are ineffective. Desensitizing injections can be used if medicine and avoidance does not work. Desensitization is when a patient is given ongoing shots until the body becomes less sensitive to the allergen. Make sure you follow up with your health care provider if problems continue. HOME CARE INSTRUCTIONS It is not possible to completely avoid allergens, but you can reduce your symptoms by taking steps to limit your exposure to them. It helps to know exactly what you are allergic to so that you can avoid your specific triggers. SEEK MEDICAL CARE IF:   You have a fever.  You develop a cough that does not stop easily (persistent).  You have shortness of breath.  You start wheezing.  Symptoms interfere with normal daily activities. Document Released: 03/14/2001 Document Revised: 06/24/2013 Document Reviewed: 02/24/2013 [facility] Patient Information 2015 [place], [place]. This information is not intended to replace advice given to you by your health care provider. Make sure you discuss any questions you have with your health care provider.

## 2014-09-09 NOTE — ED Provider Notes (Signed)
CSN: [id number]     Arrival date & time 09/09/14  1713 History   None    Chief Complaint  Patient presents with  . Conjunctivitis   (Consider location/radiation/quality/duration/timing/severity/associated sxs/prior Treatment)  HPI   Patient is a 39 year old deaf female presenting tonight with complaints of itching in bilateral eyes with watering and sneezing. Patient states that this has been an ongoing issue with congestion and drainage since she was seen here last week. Approximately 2 days ago patient states she "got glue for her fake eyelashes in both eyes which burned" and is concerned that they may have aggravated the symptoms. Patient states her eyes have been intensely itchy with some swelling of lids.   Past Medical History  Diagnosis Date  . Deaf   . Menometrorrhagia 07/17/2011  . Fibroid   . Ulcer "years ago"    Diet controlled, has not needed any medications for this    Past Surgical History  Procedure Laterality Date  . Appendectomy  1994  . Cesarean section  2008  . Myomectomy abdominal approach  2004   Family History  Problem Relation Age of Onset  . Seizures Sister   . Anesthesia problems Neg Hx    History  Substance Use Topics  . Smoking status: Never Smoker   . Smokeless tobacco: Not on file  . Alcohol Use: No   OB History    Gravida Para Term Preterm AB TAB SAB Ectopic Multiple Living   2 2 2  0 0 0 0 0 0 2     Review of Systems  Constitutional: Negative.  Negative for fever, chills and fatigue.  HENT: Positive for sinus pressure and sneezing. Negative for sore throat.        Denies sore throat, but states it is irritated from nasal drainage.   Eyes: Positive for redness and itching. Negative for photophobia, pain, discharge and visual disturbance.  Respiratory: Positive for cough. Negative for chest tightness, shortness of breath and wheezing.   Cardiovascular: Negative.  Negative for chest pain.  Gastrointestinal: Negative.  Negative for nausea,  vomiting and diarrhea.  Endocrine: Negative.   Genitourinary: Negative.   Musculoskeletal: Negative.  Negative for neck pain and neck stiffness.  Skin: Negative.  Negative for rash.  Allergic/Immunologic: Positive for environmental allergies. Negative for food allergies and immunocompromised state.  Neurological: Negative.   Hematological: Negative.   Psychiatric/Behavioral: Negative.     Allergies  Ibuprofen and Nasal spray  Home Medications   Prior to Admission medications   Medication Sig Start Date End Date Taking? Authorizing Provider  cetirizine (ZYRTEC) 10 MG tablet Take 2 tablets (20 mg total) by mouth at bedtime. 09/09/14   Nehemiah Settle, NP  Clobetasol Prop Emollient Base 0.05 % emollient cream Apply 1 Dose topically 2 (two) times daily. 06/12/14   Donnamae Jude, MD  fluconazole (DIFLUCAN) 150 MG tablet Take 1 tablet (150 mg total) by mouth daily. Repeat in 24 hours if needed 06/12/14   Donnamae Jude, MD  HYDROcodone-homatropine [facility]) 5-1.5 MG/5ML syrup Take 5 mLs by mouth every 6 (six) hours as needed for cough (and congestion). 08/01/14   Bjorn Pippin, PA-C  ipratropium (ATROVENT) 0.06 % nasal spray Place 2 sprays into both nostrils 4 (four) times daily. 09/03/14   Janne Napoleon, NP   BP 133/79 mmHg  Pulse 100  Temp(Src) 97.7 F (36.5 C) (Oral)  Resp 16  SpO2 100%  LMP 09/01/2014   Physical Exam  Constitutional: She appears well-developed and well-nourished. No  distress.  HENT:  Bilateral nasal turbinates slightly boggy in appearance with swelling. Nares patent bilaterally. Bilateral tympanic membranes pearly gray in appearance with light reflexes present and bony prominences visualized. Serous fluid present behind TMs bilaterally.     Eyes: Conjunctivae, EOM and lids are normal. Pupils are equal, round, and reactive to light. Right eye exhibits no discharge. Left eye exhibits no discharge. No scleral icterus.  Negative for injection. Funduscopic exam limited due  to pupillary constriction.  Positive red reflex. No AV nicks noted.   Cardiovascular: Normal rate, regular rhythm, normal heart sounds and intact distal pulses.  Exam reveals no gallop and no friction rub.   No murmur heard. Pulmonary/Chest: Effort normal and breath sounds normal. No respiratory distress. She has no wheezes. She has no rales. She exhibits no tenderness.  No adventitious breath sounds noted in all fields.  Skin: Skin is warm and dry. She is not diaphoretic.  Nursing note and vitals reviewed.   ED Course  Procedures (including critical care time) Labs Review Labs Reviewed - No data to display  Imaging Review No results found.   MDM   1. Allergic rhinitis, unspecified allergic rhinitis type   2. Bilateral serous otitis media, recurrence not specified, unspecified chronicity    Meds ordered this encounter  Medications  . cetirizine (ZYRTEC) 10 MG tablet    Sig: Take 2 tablets (20 mg total) by mouth at bedtime.    Dispense:  60 tablet    Refill:  2   Discussed the importance of taking the prescribed medications as directed. Patient requests prescriptions for over-the-counter medications wherever possible prescriptions provided. The patient verbalizes understanding and agrees to plan of care.        Nehemiah Settle, NP 09/09/14 2015

## 2014-09-15 ENCOUNTER — Ambulatory Visit (INDEPENDENT_AMBULATORY_CARE_PROVIDER_SITE_OTHER): Payer: Medicaid Other | Admitting: Family Medicine

## 2014-09-15 ENCOUNTER — Encounter: Payer: Self-pay | Admitting: Family Medicine

## 2014-09-15 VITALS — BP 110/77 | HR 109 | Temp 98.1°F | Ht 65.0 in | Wt 184.1 lb

## 2014-09-15 DIAGNOSIS — H9193 Unspecified hearing loss, bilateral: Secondary | ICD-10-CM

## 2014-09-15 DIAGNOSIS — R030 Elevated blood-pressure reading, without diagnosis of hypertension: Secondary | ICD-10-CM

## 2014-09-15 DIAGNOSIS — Z0000 Encounter for general adult medical examination without abnormal findings: Secondary | ICD-10-CM

## 2014-09-15 DIAGNOSIS — IMO0001 Reserved for inherently not codable concepts without codable children: Secondary | ICD-10-CM

## 2014-09-15 LAB — LIPID PANEL
CHOLESTEROL: 248 mg/dL — AB (ref 0–200)
HDL: 69 mg/dL (ref >=46)
LDL Cholesterol: 130 mg/dL — ABNORMAL HIGH (ref 0–99)
Total CHOL/HDL Ratio: 3.6 Ratio
Triglycerides: 245 mg/dL — ABNORMAL HIGH (ref <150)
VLDL: 49 mg/dL — ABNORMAL HIGH (ref 0–40)

## 2014-09-15 LAB — BASIC METABOLIC PANEL
BUN: 11 mg/dL (ref 6–23)
CHLORIDE: 102 meq/L (ref 96–112)
CO2: 23 meq/L (ref 19–32)
Calcium: 9.2 mg/dL (ref 8.4–10.5)
Creat: 0.86 mg/dL (ref 0.50–1.10)
Glucose, Bld: 105 mg/dL — ABNORMAL HIGH (ref 70–99)
POTASSIUM: 3.9 meq/L (ref 3.5–5.3)
Sodium: 135 mEq/L (ref 135–145)

## 2014-09-15 LAB — CBC
HEMATOCRIT: 36.3 % (ref 36.0–46.0)
HEMOGLOBIN: 11.8 g/dL — AB (ref 12.0–15.0)
MCH: 22.4 pg — ABNORMAL LOW (ref 26.0–34.0)
MCHC: 32.5 g/dL (ref 30.0–36.0)
MCV: 69 fL — ABNORMAL LOW (ref 78.0–100.0)
MPV: 9.1 fL (ref 8.6–12.4)
Platelets: 319 10*3/uL (ref 150–400)
RBC: 5.26 MIL/uL — ABNORMAL HIGH (ref 3.87–5.11)
RDW: 17.6 % — ABNORMAL HIGH (ref 11.5–15.5)
WBC: 5.6 10*3/uL (ref 4.0–10.5)

## 2014-09-15 LAB — TSH: TSH: 1.702 u[IU]/mL (ref 0.350–4.500)

## 2014-09-15 NOTE — Patient Instructions (Signed)
For today, everything looks good.  My recommendation is to hold off on the mammogram until you turn 48.  Blood tests today.  Come back in 3 - 6 months for a Pap smear.    It was very good to see you!

## 2014-09-15 NOTE — Progress Notes (Signed)
Subjective:    Ashley Erickson is a 39 y.o. female who presents to [facility] today for CPE/mammogram:  1.  Physical exam:  Patient has no complaints today.  She is doing well.  No changes in her health in past year.    The patient denies fever, unusual weight change, decreased hearing, chest pain, palpitations, pre-syncopal or syncopal episodes, dyspnea on exertion, prolonged cough, hemoptysis, change in bowel habits, melena, hematochezia, severe indigestion/heartburn, nausea/vomiting/abdominal pain, genital sores, muscle weakness, difficulty walking, abnormal bleeding, or enlarged lymph nodes.    She is asking for a mammogram today.  She needs a Pap smear but would like to hold off until her next visit.    The following portions of the patient's history were reviewed and updated as appropriate: allergies, current medications, past medical history, family and social history, and problem list. Patient is a nonsmoker.    PMH reviewed.  Past Medical History  Diagnosis Date  . Deaf   . Menometrorrhagia 07/17/2011  . Fibroid   . Ulcer "years ago"    Diet controlled, has not needed any medications for this    Past Surgical History  Procedure Laterality Date  . Appendectomy  1994  . Cesarean section  2008  . Myomectomy abdominal approach  2004    Medications reviewed. Current Outpatient Prescriptions  Medication Sig Dispense Refill  . cetirizine (ZYRTEC) 10 MG tablet Take 2 tablets (20 mg total) by mouth at bedtime. 60 tablet 2  . Clobetasol Prop Emollient Base 0.05 % emollient cream Apply 1 Dose topically 2 (two) times daily. 30 g 3  . fluconazole (DIFLUCAN) 150 MG tablet Take 1 tablet (150 mg total) by mouth daily. Repeat in 24 hours if needed 2 tablet 2  . HYDROcodone-homatropine (HYCODAN) 5-1.5 MG/5ML syrup Take 5 mLs by mouth every 6 (six) hours as needed for cough (and congestion). 120 mL 0  . ipratropium (ATROVENT) 0.06 % nasal spray Place 2 sprays into both nostrils 4 (four) times daily.  15 mL 12   No current facility-administered medications for this visit.     Objective:   Physical Exam BP 110/77 mmHg  Pulse 109  Temp(Src) 98.1 F (36.7 C) (Oral)  Ht 5\' 5"  (1.651 m)  Wt 184 lb 1.6 oz (83.507 kg)  BMI 30.64 kg/m2  LMP 09/01/2014 Gen:  Alert, cooperative patient who appears stated age in no acute distress.  Vital signs reviewed. HEENT: EOMI,  MMM Cardiac:  Regular rate and rhythm without murmur auscultated.  Good S1/S2. Pulm:  Clear to auscultation bilaterally with good air movement.  No wheezes or rales noted.   Abd:  Soft/nondistended/nontender.  Good bowel sounds throughout all four quadrants.  No masses noted.  Exts: Non edematous BL  LE, warm and well perfused.  Neuro:  No focal deficits throughout. Psych:  Pleasant, conversant, not depressed or anxious appearing.    No results found for this or any previous visit (from the past 72 hour(s)).

## 2014-09-16 DIAGNOSIS — Z0000 Encounter for general adult medical examination without abnormal findings: Secondary | ICD-10-CM | POA: Insufficient documentation

## 2014-09-16 NOTE — Assessment & Plan Note (Signed)
In- person sign interpretation provided today.

## 2014-09-16 NOTE — Assessment & Plan Note (Signed)
Discussed various recommendations surrounding mammograms.  Patient opts to start testing at age 39, biannually.   Appreciative of discussion.

## 2014-09-21 ENCOUNTER — Encounter: Payer: Self-pay | Admitting: Family Medicine

## 2014-10-03 ENCOUNTER — Encounter (HOSPITAL_COMMUNITY): Payer: Self-pay | Admitting: Emergency Medicine

## 2014-10-03 ENCOUNTER — Emergency Department (INDEPENDENT_AMBULATORY_CARE_PROVIDER_SITE_OTHER)
Admission: EM | Admit: 2014-10-03 | Discharge: 2014-10-03 | Disposition: A | Discharge status: Home / Self Care | Payer: Medicaid Other | Source: Home / Self Care | Attending: Family Medicine | Admitting: Family Medicine

## 2014-10-03 DIAGNOSIS — J302 Other seasonal allergic rhinitis: Secondary | ICD-10-CM | POA: Diagnosis not present

## 2014-10-03 MED ORDER — FLUTICASONE PROPIONATE 50 MCG/ACT NA SUSP: 2.0000 | Freq: Every day | NASAL | Status: AC

## 2014-10-03 MED ORDER — PREDNISONE 10 MG PO KIT: PACK | ORAL | Status: DC

## 2014-10-03 NOTE — ED Provider Notes (Signed)
Ashley Erickson is a 39 y.o. female who presents to Urgent Care today for cough sneezing sore throat and body aches I congestion and runny nose. Symptoms present for one week. No trouble breathing vomiting diarrhea. Patient has used Psychiatric nurse which helps some. No fevers or chills. She feels well otherwise. No trouble breathing.   Past Medical History  Diagnosis Date  . Deaf   . Menometrorrhagia 07/17/2011  . Fibroid   . Ulcer "years ago"    Diet controlled, has not needed any medications for this    Past Surgical History  Procedure Laterality Date  . Appendectomy  1994  . Cesarean section  2008  . Myomectomy abdominal approach  2004   History  Substance Use Topics  . Smoking status: Never Smoker   . Smokeless tobacco: Not on file  . Alcohol Use: No   ROS as above Medications: No current facility-administered medications for this encounter.   Current Outpatient Prescriptions  Medication Sig Dispense Refill  . cetirizine (ZYRTEC) 10 MG tablet Take 2 tablets (20 mg total) by mouth at bedtime. 60 tablet 2  . Clobetasol Prop Emollient Base 0.05 % emollient cream Apply 1 Dose topically 2 (two) times daily. 30 g 3  . fluconazole (DIFLUCAN) 150 MG tablet Take 1 tablet (150 mg total) by mouth daily. Repeat in 24 hours if needed 2 tablet 2  . fluticasone (FLONASE) 50 MCG/ACT nasal spray Place 2 sprays into both nostrils daily. 16 g 2  . HYDROcodone-homatropine (HYCODAN) 5-1.5 MG/5ML syrup Take 5 mLs by mouth every 6 (six) hours as needed for cough (and congestion). 120 mL 0  . ipratropium (ATROVENT) 0.06 % nasal spray Place 2 sprays into both nostrils 4 (four) times daily. 15 mL 12  . PredniSONE 10 MG KIT 6 day dose pack po 1 kit 0   Allergies  Allergen Reactions  . Ibuprofen Other (See Comments)    Patient states intolerance to it-- had GI disturbances  . Nasal Spray      Exam:  BP 130/88 mmHg  Pulse 93  Temp(Src) 97.8 F (36.6 C)  Resp 18  SpO2 100%  LMP  09/01/2014 Gen: Well NAD HEENT: EOMI,  MMM mild conjunctival injection with clear discharge. Nasal turbinates inflamed bilaterally with clear discharge. Posterior pharynx with cobblestoning. Normal tympanic membranes bilaterally. Lungs: Normal work of breathing. CTABL Heart: RRR no MRG Abd: NABS, Soft. Nondistended, Nontender Exts: Brisk capillary refill, warm and well perfused.   No results found for this or any previous visit (from the past 24 hour(s)). No results found.  Assessment and Plan: 39 y.o. female with allergic rhinitis. Continue Zaditor, Zyrtec, and Hycodan. Add Flonase. Treat with prednisone dose pack for 6 days. Follow-up with PCP.  Discussed warning signs or symptoms. Please see discharge instructions. Patient expresses understanding.     Gregor Hams, MD 10/03/14 2008

## 2014-10-03 NOTE — ED Notes (Signed)
Via son, who's helping to interpret sign language Pt c/o cold sx onset 1 week Seen here on 3/3 for similar sx; given cetirizine and cough syrup w/no relief Sx today include cough, BA, weakness, fevers Alert, no signs of acute distress.

## 2014-10-03 NOTE — Discharge Instructions (Signed)
Thank you for coming in today.   Allergic Rhinitis Allergic rhinitis is when the mucous membranes in the nose respond to allergens. Allergens are particles in the air that cause your body to have an allergic reaction. This causes you to release allergic antibodies. Through a chain of events, these eventually cause you to release histamine into the blood stream. Although meant to protect the body, it is this release of histamine that causes your discomfort, such as frequent sneezing, congestion, and an itchy, runny nose.  CAUSES  Seasonal allergic rhinitis (hay fever) is caused by pollen allergens that may come from grasses, trees, and weeds. Year-round allergic rhinitis (perennial allergic rhinitis) is caused by allergens such as house dust mites, pet dander, and mold spores.  SYMPTOMS   Nasal stuffiness (congestion).  Itchy, runny nose with sneezing and tearing of the eyes. DIAGNOSIS  Your health care provider can help you determine the allergen or allergens that trigger your symptoms. If you and your health care provider are unable to determine the allergen, skin or blood testing may be used. TREATMENT  Allergic rhinitis does not have a cure, but it can be controlled by:  Medicines and allergy shots (immunotherapy).  Avoiding the allergen. Hay fever may often be treated with antihistamines in pill or nasal spray forms. Antihistamines block the effects of histamine. There are over-the-counter medicines that may help with nasal congestion and swelling around the eyes. Check with your health care provider before taking or giving this medicine.  If avoiding the allergen or the medicine prescribed do not work, there are many new medicines your health care provider can prescribe. Stronger medicine may be used if initial measures are ineffective. Desensitizing injections can be used if medicine and avoidance does not work. Desensitization is when a patient is given ongoing shots until the body  becomes less sensitive to the allergen. Make sure you follow up with your health care provider if problems continue. HOME CARE INSTRUCTIONS It is not possible to completely avoid allergens, but you can reduce your symptoms by taking steps to limit your exposure to them. It helps to know exactly what you are allergic to so that you can avoid your specific triggers. SEEK MEDICAL CARE IF:   You have a fever.  You develop a cough that does not stop easily (persistent).  You have shortness of breath.  You start wheezing.  Symptoms interfere with normal daily activities. Document Released: 03/14/2001 Document Revised: 06/24/2013 Document Reviewed: 02/24/2013 [facility] Patient Information 2015 [place], [place]. This information is not intended to replace advice given to you by your health care provider. Make sure you discuss any questions you have with your health care provider.

## 2014-10-08 ENCOUNTER — Encounter (HOSPITAL_COMMUNITY): Payer: Self-pay | Admitting: Emergency Medicine

## 2014-10-08 ENCOUNTER — Emergency Department (HOSPITAL_COMMUNITY)
Admission: EM | Admit: 2014-10-08 | Discharge: 2014-10-08 | Disposition: A | Discharge status: Home / Self Care | Payer: Medicaid Other | Attending: Emergency Medicine | Admitting: Emergency Medicine

## 2014-10-08 DIAGNOSIS — Z9049 Acquired absence of other specified parts of digestive tract: Secondary | ICD-10-CM | POA: Insufficient documentation

## 2014-10-08 DIAGNOSIS — Z7951 Long term (current) use of inhaled steroids: Secondary | ICD-10-CM | POA: Insufficient documentation

## 2014-10-08 DIAGNOSIS — Z79899 Other long term (current) drug therapy: Secondary | ICD-10-CM | POA: Insufficient documentation

## 2014-10-08 DIAGNOSIS — Z8742 Personal history of other diseases of the female genital tract: Secondary | ICD-10-CM | POA: Diagnosis not present

## 2014-10-08 DIAGNOSIS — R1013 Epigastric pain: Secondary | ICD-10-CM | POA: Insufficient documentation

## 2014-10-08 DIAGNOSIS — H9190 Unspecified hearing loss, unspecified ear: Secondary | ICD-10-CM | POA: Diagnosis not present

## 2014-10-08 DIAGNOSIS — Z872 Personal history of diseases of the skin and subcutaneous tissue: Secondary | ICD-10-CM | POA: Insufficient documentation

## 2014-10-08 LAB — URINE MICROSCOPIC-ADD ON

## 2014-10-08 LAB — CBC WITH DIFFERENTIAL/PLATELET
Basophils Absolute: 0 10*3/uL (ref 0.0–0.1)
Basophils Relative: 0 % (ref 0–1)
Eosinophils Absolute: 0.1 10*3/uL (ref 0.0–0.7)
Eosinophils Relative: 1 % (ref 0–5)
HCT: 37.7 % (ref 36.0–46.0)
Hemoglobin: 11.9 g/dL — ABNORMAL LOW (ref 12.0–15.0)
Lymphocytes Relative: 24 % (ref 12–46)
Lymphs Abs: 1.6 10*3/uL (ref 0.7–4.0)
MCH: 22.4 pg — ABNORMAL LOW (ref 26.0–34.0)
MCHC: 31.6 g/dL (ref 30.0–36.0)
MCV: 71 fL — ABNORMAL LOW (ref 78.0–100.0)
Monocytes Absolute: 0.3 10*3/uL (ref 0.1–1.0)
Monocytes Relative: 4 % (ref 3–12)
Neutro Abs: 4.5 10*3/uL (ref 1.7–7.7)
Neutrophils Relative %: 71 % (ref 43–77)
Platelets: UNDETERMINED 10*3/uL (ref 150–400)
RBC: 5.31 MIL/uL — ABNORMAL HIGH (ref 3.87–5.11)
RDW: 16 % — ABNORMAL HIGH (ref 11.5–15.5)
WBC: 6.5 10*3/uL (ref 4.0–10.5)

## 2014-10-08 LAB — COMPREHENSIVE METABOLIC PANEL
ALT: 28 U/L (ref 0–35)
AST: 29 U/L (ref 0–37)
Albumin: 4.1 g/dL (ref 3.5–5.2)
Alkaline Phosphatase: 110 U/L (ref 39–117)
Anion gap: 8 (ref 5–15)
BUN: 15 mg/dL (ref 6–23)
CO2: 22 mmol/L (ref 19–32)
Calcium: 8.6 mg/dL (ref 8.4–10.5)
Chloride: 105 mmol/L (ref 96–112)
Creatinine, Ser: 0.67 mg/dL (ref 0.50–1.10)
GFR calc Af Amer: >90 mL/min (ref >90)
GFR calc non Af Amer: >90 mL/min (ref >90)
Glucose, Bld: 110 mg/dL — ABNORMAL HIGH (ref 70–99)
Potassium: 4.2 mmol/L (ref 3.5–5.1)
Sodium: 135 mmol/L (ref 135–145)
Total Bilirubin: 0.3 mg/dL (ref 0.3–1.2)
Total Protein: 7.8 g/dL (ref 6.0–8.3)

## 2014-10-08 LAB — URINALYSIS, ROUTINE W REFLEX MICROSCOPIC
Bilirubin Urine: NEGATIVE
Glucose, UA: NEGATIVE mg/dL
Hgb urine dipstick: NEGATIVE
Ketones, ur: NEGATIVE mg/dL
Nitrite: NEGATIVE
Protein, ur: NEGATIVE mg/dL
Specific Gravity, Urine: 1.022 (ref 1.005–1.030)
Urobilinogen, UA: 0.2 mg/dL (ref 0.0–1.0)
pH: 7.5 (ref 5.0–8.0)

## 2014-10-08 LAB — LIPASE, BLOOD: Lipase: 20 U/L (ref 11–59)

## 2014-10-08 MED ORDER — OMEPRAZOLE 20 MG PO CPDR: 20.0000 mg | DELAYED_RELEASE_CAPSULE | Freq: Two times a day (BID) | ORAL | Status: AC

## 2014-10-08 MED ORDER — ONDANSETRON HCL 4 MG PO TABS: 4.0000 mg | ORAL_TABLET | Freq: Four times a day (QID) | ORAL | Status: AC

## 2014-10-08 NOTE — ED Notes (Signed)
Per EMS: Pt from [facility] c/o mid abd pain.  States hx of gastric ulcers.  Pt uses ASL.  Denies NVD.  Pain started an hour ago.

## 2014-10-14 NOTE — ED Provider Notes (Signed)
CSN: [id number]     Arrival date & time 10/08/14  1221 History   First MD Initiated Contact with Patient 10/08/14 1556     Chief Complaint  Patient presents with  . Abdominal Pain     (Consider location/radiation/quality/duration/timing/severity/associated sxs/prior Treatment) HPI   Patient is deaf. Sign language interpreter utilized for history. 39-year-old female with abdominal pain. Patient is in the epigastrium. Patient has had similar type pain intermittently for the past several years. Today she is at school using the computer when she began having sharp pain in her epigastrium. Associated bloating sensation. Sometimes the pain radiates through to her back. Nauseated at times. Did not vomit today. No urinary complaints. No unusual vaginal bleeding or discharge. She has been previously told that she may have "stomach ulcer."  Past Medical History  Diagnosis Date  . Deaf   . Menometrorrhagia 07/17/2011  . Fibroid   . Ulcer "years ago"    Diet controlled, has not needed any medications for this    Past Surgical History  Procedure Laterality Date  . Appendectomy  1994  . Cesarean section  2008  . Myomectomy abdominal approach  2004   Family History  Problem Relation Age of Onset  . Seizures Sister   . Anesthesia problems Neg Hx    History  Substance Use Topics  . Smoking status: Never Smoker   . Smokeless tobacco: Not on file  . Alcohol Use: No   OB History    Gravida Para Term Preterm AB TAB SAB Ectopic Multiple Living   2 2 2 0 0 0 0 0 0 2     Review of Systems  All systems reviewed and negative, other than as noted in HPI.   Allergies  Ibuprofen and Nasal spray  Home Medications   Prior to Admission medications   Medication Sig Start Date End Date Taking? Authorizing Provider  cetirizine (ZYRTEC) 10 MG tablet Take 2 tablets (20 mg total) by mouth at bedtime. 09/09/14  Yes Catherine H Rossi, NP  Clobetasol Prop Emollient Base 0.05 % emollient cream Apply 1  Dose topically 2 (two) times daily. 06/12/14  Yes Tanya S Pratt, MD  fluticasone (FLONASE) 50 MCG/ACT nasal spray Place 2 sprays into both nostrils daily. 10/03/14  Yes Evan S Corey, MD  fluconazole (DIFLUCAN) 150 MG tablet Take 1 tablet (150 mg total) by mouth daily. Repeat in 24 hours if needed Patient not taking: Reported on 10/08/2014 06/12/14   Tanya S Pratt, MD  HYDROcodone-homatropine (HYCODAN) 5-1.5 MG/5ML syrup Take 5 mLs by mouth every 6 (six) hours as needed for cough (and congestion). Patient not taking: Reported on 10/08/2014 08/01/14   Michelle G Young, PA-C  ipratropium (ATROVENT) 0.06 % nasal spray Place 2 sprays into both nostrils 4 (four) times daily. Patient not taking: Reported on 10/08/2014 09/03/14   David Mabe, NP  omeprazole (PRILOSEC) 20 MG capsule Take 1 capsule (20 mg total) by mouth 2 (two) times daily before a meal. 10/08/14   Stephen Kohut, MD  ondansetron (ZOFRAN) 4 MG tablet Take 1 tablet (4 mg total) by mouth every 6 (six) hours. 10/08/14   Stephen Kohut, MD  PredniSONE 10 MG KIT 6 day dose pack po Patient not taking: Reported on 10/08/2014 10/03/14   Evan S Corey, MD   BP 130/82 mmHg  Pulse 79  Temp(Src) 98.1 F (36.7 C) (Oral)  Resp 16  SpO2 98% Physical Exam  Constitutional: She appears well-developed and well-nourished. No distress.  HENT:  Head: Normocephalic   and atraumatic.  Eyes: Conjunctivae are normal. Right eye exhibits no discharge. Left eye exhibits no discharge.  Neck: Neck supple.  Cardiovascular: Normal rate, regular rhythm and normal heart sounds.  Exam reveals no gallop and no friction rub.   No murmur heard. Pulmonary/Chest: Effort normal and breath sounds normal. No respiratory distress.  Abdominal: Soft. She exhibits no distension. There is tenderness.  Mild epigastric tenderness without rebound or guarding.  Musculoskeletal: She exhibits no edema or tenderness.  Neurological: She is alert.  Skin: Skin is warm and dry.  Psychiatric: She has a normal  mood and affect. Her behavior is normal. Thought content normal.  Nursing note and vitals reviewed.   ED Course  Procedures (including critical care time) Labs Review Labs Reviewed  CBC WITH DIFFERENTIAL/PLATELET - Abnormal; Notable for the following:    RBC 5.31 (*)    Hemoglobin 11.9 (*)    MCV 71.0 (*)    MCH 22.4 (*)    RDW 16.0 (*)    All other components within normal limits  COMPREHENSIVE METABOLIC PANEL - Abnormal; Notable for the following:    Glucose, Bld 110 (*)    All other components within normal limits  URINALYSIS, ROUTINE W REFLEX MICROSCOPIC - Abnormal; Notable for the following:    APPearance CLOUDY (*)    Leukocytes, UA SMALL (*)    All other components within normal limits  URINE MICROSCOPIC-ADD ON - Abnormal; Notable for the following:    Squamous Epithelial / LPF FEW (*)    Bacteria, UA FEW (*)    All other components within normal limits  LIPASE, BLOOD    Imaging Review No results found.   EKG Interpretation None      MDM   Final diagnoses:  Epigastric pain    39 year old female with epigastric pain. Suspect peptic ulcer disease. Abdominal exam is benign. Patient is not currently on a PPI. She was started on 1. Low suspicion for more serious etiology. Return precautions were discussed. Outpatient follow-up otherwise.    Virgel Manifold, MD 10/14/14 [phone]

## 2014-10-24 ENCOUNTER — Emergency Department (HOSPITAL_COMMUNITY)
Admission: EM | Admit: 2014-10-24 | Discharge: 2014-10-24 | Disposition: A | Discharge status: Home / Self Care | Payer: Medicaid Other | Source: Home / Self Care | Attending: Family Medicine | Admitting: Family Medicine

## 2014-10-24 ENCOUNTER — Encounter (HOSPITAL_COMMUNITY): Payer: Self-pay | Admitting: Emergency Medicine

## 2014-10-24 DIAGNOSIS — H1011 Acute atopic conjunctivitis, right eye: Secondary | ICD-10-CM | POA: Diagnosis not present

## 2014-10-24 DIAGNOSIS — S0501XA Injury of conjunctiva and corneal abrasion without foreign body, right eye, initial encounter: Secondary | ICD-10-CM | POA: Diagnosis not present

## 2014-10-24 MED ORDER — DICLOFENAC SODIUM 0.1 % OP SOLN: 1.0000 [drp] | Freq: Four times a day (QID) | OPHTHALMIC | Status: AC

## 2014-10-24 MED ORDER — POLYMYXIN B-TRIMETHOPRIM 10000-0.1 UNIT/ML-% OP SOLN: 1.0000 [drp] | OPHTHALMIC | Status: AC

## 2014-10-24 MED ORDER — TETRACAINE HCL 0.5 % OP SOLN
OPHTHALMIC | Status: AC
  Filled 2014-10-24: qty 2

## 2014-10-24 NOTE — ED Provider Notes (Signed)
Ashley Erickson is a 39 y.o. female who presents to Urgent Care today for itchy painful eyes. Patient was doing well this morning when she developed itchy watery eyes. She rubbed her eyes and developed eye pain and mild photophobia. She's used some Zaditor drops which helped a little. No fevers or chills nausea vomiting or diarrhea. No eye injury.   Past Medical History  Diagnosis Date  . Deaf   . Menometrorrhagia 07/17/2011  . Fibroid   . Ulcer "years ago"    Diet controlled, has not needed any medications for this    Past Surgical History  Procedure Laterality Date  . Appendectomy  1994  . Cesarean section  2008  . Myomectomy abdominal approach  2004   History  Substance Use Topics  . Smoking status: Never Smoker   . Smokeless tobacco: Not on file  . Alcohol Use: No   ROS as above Medications: No current facility-administered medications for this encounter.   Current Outpatient Prescriptions  Medication Sig Dispense Refill  . cetirizine (ZYRTEC) 10 MG tablet Take 2 tablets (20 mg total) by mouth at bedtime. 60 tablet 2  . Clobetasol Prop Emollient Base 0.05 % emollient cream Apply 1 Dose topically 2 (two) times daily. 30 g 3  . diclofenac (VOLTAREN) 0.1 % ophthalmic solution Place 1 drop into the right eye 4 (four) times daily. 5 mL 0  . fluticasone (FLONASE) 50 MCG/ACT nasal spray Place 2 sprays into both nostrils daily. 16 g 2  . omeprazole (PRILOSEC) 20 MG capsule Take 1 capsule (20 mg total) by mouth 2 (two) times daily before a meal. 60 capsule 0  . ondansetron (ZOFRAN) 4 MG tablet Take 1 tablet (4 mg total) by mouth every 6 (six) hours. 12 tablet 0  . trimethoprim-polymyxin b (POLYTRIM) ophthalmic solution Place 1 drop into the right eye every 4 (four) hours. 10 mL 0   Allergies  Allergen Reactions  . Ibuprofen Other (See Comments)    Patient states intolerance to it-- had GI disturbances  . Nasal Spray Other (See Comments)    Sick     Exam:  BP 132/79 mmHg   Pulse 83  Temp(Src) 98.3 F (36.8 C) (Oral)  Resp 16  SpO2 98%  LMP 10/14/2014 (Within Days) Gen: Well NAD HEENT: EOMI,  MMM mild right-sided conjunctiva injection normal left. Watery discharge present. Right eye tiny corneal abrasion at the 6:00 position seen on fluorescein staining. Lungs: Normal work of breathing. CTABL Heart: RRR no MRG Abd: NABS, Soft. Nondistended, Nontender Exts: Brisk capillary refill, warm and well perfused.   No results found for this or any previous visit (from the past 24 hour(s)). No results found.  Assessment and Plan: 39 y.o. female with corneal abrasion and allergic conjunctivitis treat with Zaditor Voltaren drops and Polytrim drops  Discussed warning signs or symptoms. Please see discharge instructions. Patient expresses understanding.     Gregor Hams, MD 10/24/14 [phone]

## 2014-10-24 NOTE — Discharge Instructions (Signed)
Thank you for coming in today. Continue Zaditor drops.  Also start the polytrim and voltaren drops.  Return as needed.   Corneal Abrasion The cornea is the clear covering at the front and center of the eye. When looking at the colored portion of the eye (iris), you are looking through the cornea. This very thin tissue is made up of many layers. The surface layer is a single layer of cells (corneal epithelium) and is one of the most sensitive tissues in the body. If a scratch or injury causes the corneal epithelium to come off, it is called a corneal abrasion. If the injury extends to the tissues below the epithelium, the condition is called a corneal ulcer. CAUSES   Scratches.  Trauma.  Foreign body in the eye. Some people have recurrences of abrasions in the area of the original injury even after it has healed (recurrent erosion syndrome). Recurrent erosion syndrome generally improves and goes away with time. SYMPTOMS   Eye pain.  Difficulty or inability to keep the injured eye open.  The eye becomes very sensitive to light.  Recurrent erosions tend to happen suddenly, first thing in the morning, usually after waking up and opening the eye. DIAGNOSIS  Your health care provider can diagnose a corneal abrasion during an eye exam. Dye is usually placed in the eye using a drop or a small paper strip moistened by your tears. When the eye is examined with a special light, the abrasion shows up clearly because of the dye. TREATMENT   Small abrasions may be treated with antibiotic drops or ointment alone.  A pressure patch may be put over the eye. If this is done, follow your doctor's instructions for when to remove the patch. Do not drive or use machines while the eye patch is on. Judging distances is hard to do with a patch on. If the abrasion becomes infected and spreads to the deeper tissues of the cornea, a corneal ulcer can result. This is serious because it can cause corneal scarring.  Corneal scars interfere with light passing through the cornea and cause a loss of vision in the involved eye. HOME CARE INSTRUCTIONS  Use medicine or ointment as directed. Only take over-the-counter or prescription medicines for pain, discomfort, or fever as directed by your health care provider.  Do not drive or operate machinery if your eye is patched. Your ability to judge distances is impaired.  If your health care provider has given you a follow-up appointment, it is very important to keep that appointment. Not keeping the appointment could result in a severe eye infection or permanent loss of vision. If there is any problem keeping the appointment, let your health care provider know. SEEK MEDICAL CARE IF:   You have pain, light sensitivity, and a scratchy feeling in one eye or both eyes.  Your pressure patch keeps loosening up, and you can blink your eye under the patch after treatment.  Any kind of discharge develops from the eye after treatment or if the lids stick together in the morning.  You have the same symptoms in the morning as you did with the original abrasion days, weeks, or months after the abrasion healed. MAKE SURE YOU:   Understand these instructions.  Will watch your condition.  Will get help right away if you are not doing well or get worse. Document Released: 06/16/2000 Document Revised: 06/24/2013 Document Reviewed: 02/24/2013 [facility] Patient Information 2015 [place], [place]. This information is not intended to replace advice given  to you by your health care provider. Make sure you discuss any questions you have with your health care provider.    Allergic Conjunctivitis The conjunctiva is a thin membrane that covers the visible white part of the eyeball and the underside of the eyelids. This membrane protects and lubricates the eye. The membrane has small blood vessels running through it that can normally be seen. When the conjunctiva becomes inflamed, the  condition is called conjunctivitis. In response to the inflammation, the conjunctival blood vessels become swollen. The swelling results in redness in the normally white part of the eye. The blood vessels of this membrane also react when a person has allergies and is then called allergic conjunctivitis. This condition usually lasts for as long as the allergy persists. Allergic conjunctivitis cannot be passed to another person (non-contagious). The likelihood of bacterial infection is great and the cause is not likely due to allergies if the inflamed eye has:  A sticky discharge.  Discharge or sticking together of the lids in the morning.  Scaling or flaking of the eyelids where the eyelashes come out.  Red swollen eyelids. CAUSES   Viruses.  Irritants such as foreign bodies.  Chemicals.  General allergic reactions.  Inflammation or serious diseases in the inside or the outside of the eye or the orbit (the boney cavity in which the eye sits) can cause a "red eye." SYMPTOMS   Eye redness.  Tearing.  Itchy eyes.  Burning feeling in the eyes.  Clear drainage from the eye.  Allergic reaction due to pollens or ragweed sensitivity. Seasonal allergic conjunctivitis is frequent in the spring when pollens are in the air and in the fall. DIAGNOSIS  This condition, in its many forms, is usually diagnosed based on the history and an ophthalmological exam. It usually involves both eyes. If your eyes react at the same time every year, allergies may be the cause. While most "red eyes" are due to allergy or an infection, the role of an eye (ophthalmological) exam is important. The exam can rule out serious diseases of the eye or orbit. TREATMENT   Non-antibiotic eye drops, ointments, or medications by mouth may be prescribed if the ophthalmologist is sure the conjunctivitis is due to allergies alone.  Over-the-counter drops and ointments for allergic symptoms should be used only after other  causes of conjunctivitis have been ruled out, or as your caregiver suggests. Medications by mouth are often prescribed if other allergy-related symptoms are present. If the ophthalmologist is sure that the conjunctivitis is due to allergies alone, treatment is normally limited to drops or ointments to reduce itching and burning. HOME CARE INSTRUCTIONS   Wash hands before and after applying drops or ointments, or touching the inflamed eye(s) or eyelids.  Do not let the eye dropper tip or ointment tube touch the eyelid when putting medicine in your eye.  Stop using your soft contact lenses and throw them away. Use a new pair of lenses when recovery is complete. You should run through sterilizing cycles at least three times before use after complete recovery if the old soft contact lenses are to be used. Hard contact lenses should be stopped. They need to be thoroughly sterilized before use after recovery.  Itching and burning eyes due to allergies is often relieved by using a cool cloth applied to closed eye(s). SEEK MEDICAL CARE IF:   Your problems do not go away after two or three days of treatment.  Your lids are sticky (especially in the  morning when you wake up) or stick together.  Discharge develops. Antibiotics may be needed either as drops, ointment, or by mouth.  You have extreme light sensitivity.  An oral temperature above 102 F (38.9 C) develops.  Pain in or around the eye or any other visual symptom develops. MAKE SURE YOU:   Understand these instructions.  Will watch your condition.  Will get help right away if you are not doing well or get worse. Document Released: 09/09/2002 Document Revised: 09/11/2011 Document Reviewed: 08/05/2007 [facility] Patient Information 2015 [place], [place]. This information is not intended to replace advice given to you by your health care provider. Make sure you discuss any questions you have with your health care provider.

## 2014-10-24 NOTE — ED Notes (Signed)
C/o right eye irritation which started this morning States eye is watery and has yellow crust on eye lid States eye does itch Zaditor was used as tx

## 2014-12-09 ENCOUNTER — Telehealth: Payer: Self-pay | Admitting: Family Medicine

## 2014-12-09 MED ORDER — ANTIPYRINE-BENZOCAINE 5.4-1.4 % OT SOLN: OTIC | Status: AC

## 2014-12-09 NOTE — Telephone Encounter (Signed)
Pt called and needs a refill on her Antipyrine-Benzocaine called in . She is having the neck and ear issue again and this is what she took last time to get rid of this. Please call patient when called in so that she knows to go pick this up. If she doesn't answer please leave a message. jw

## 2014-12-09 NOTE — Telephone Encounter (Signed)
Prescription sent.  Please call patient to let her know.  Thanks! JW

## 2014-12-10 NOTE — Telephone Encounter (Signed)
Contacted pt and informed her of below. Zimmerman Rumple, Akita Maxim D, CMA  

## 2015-03-26 ENCOUNTER — Telehealth: Payer: Self-pay | Admitting: Family Medicine

## 2015-03-26 NOTE — Telephone Encounter (Signed)
Pt called because she faxed a form on 9/22 to be filled out and fax to the state department . She wanted to know did we receive the form and if not let her know so it can be faxed back. Also if we did get the form how long would it take to be filled out and fax to the number on the form? The form has to be faxed to them by 04/01/15. Please inform patient. jw

## 2015-03-29 NOTE — Telephone Encounter (Signed)
The form just arrived.  However, I have bad news.  This is a disability form which we do not do here.  Furthermore, this is a very detailed form that requires a doctor's visit to determine how much she can sit, stand, how much weight she can lift, etc.  But again, as a clinic policy we don't complete forms for disability.  Please call and let the patient know.  Thank you!  I'll leave this paperwork in my box if she needs to pick it up.

## 2015-03-29 NOTE — Telephone Encounter (Signed)
Pt says " please read them through carefully. They have to be faxed back by Sept 29. The dept of Social services in [place] has asked pt to ask dr to fax the medical records directly to them." It was told to the pt she could come in and request for her records to be sent to DSS.

## 2015-03-29 NOTE — Telephone Encounter (Signed)
Dr. Mingo Amber, Have you seen this form. Ottis Stain, CMA

## 2015-03-29 NOTE — Telephone Encounter (Signed)
No form has arrived in my box.  Please ask her to fax it again and mark that it should come to me.

## 2015-03-30 NOTE — Telephone Encounter (Signed)
After talking Tomasa Hosteller and Devoria Glassing, pt was told to have DSS fax a request to [place] for pts medical records.  Pt understood

## 2015-06-26 ENCOUNTER — Ambulatory Visit
Admission: AD | Admit: 2015-06-26 | Discharge: 2015-06-26 | Disposition: A | Discharge status: Home / Self Care | Payer: Self-pay | Attending: Emergency Medicine | Admitting: Emergency Medicine

## 2015-06-26 DIAGNOSIS — B3749 Other urogenital candidiasis: Secondary | ICD-10-CM

## 2015-06-26 DIAGNOSIS — K3184 Gastroparesis: Secondary | ICD-10-CM

## 2015-06-26 HISTORY — DX: Reserved for inherently not codable concepts without codable children: IMO0001

## 2015-06-26 HISTORY — DX: Unspecified hearing loss, unspecified ear: H91.90

## 2015-06-26 MED ORDER — FLUCONAZOLE 150 MG PO TABS *I*
150.0000 mg | ORAL_TABLET | Freq: Once | ORAL | 0 refills | Status: AC
Start: 2015-06-26 — End: 2015-06-26

## 2015-06-26 NOTE — ED Triage Notes (Signed)
pt with c/o abd pain after eating since 2011, yesterday started with vaginal discomfort and slight bleeding denies any odor or discharge        Triage Note   Gavin PoundElizabeth M Kennedy-Gebo, RN

## 2015-06-26 NOTE — UC Provider Note (Signed)
History     Chief Complaint   Patient presents with    Abdominal Pain     pt with c/o abd pain after eating since 2011, yesterday started with vaginal discomfort and slight bleeding denies any odor or discharge     Vaginal Pain   39 y.o. Female, 2 complaints:  1) 5 years of mild to mod bloating after eating and drinking, occurs after any p.o. Intake. No worse with any foods, relief at times with BM. Doesn't wake feeling bloated, starts after first meal. Occurs periodically after eating throughout day. Minimal pain. No N/V. Normal BMs.  2) Mild vaginal irrit and discomfort for 1 day with assoc scant blood when wiping. Worse yesterday. No c/o vaginal d/c. Last sexual activity 2 weeks ago. LMP Dec 10.    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 No pertinent past surgical history.    History reviewed. No pertinent family history.      Social History    reports that she has never smoked. She does not have any smokeless tobacco history on file. She reports that she does not drink alcohol. Her drug and sexual activity histories are not on file.    Living Situation     Questions Responses    Patient lives with Family    Homeless No    Caregiver for other family member No    External Services None    Employment Unemployed    Domestic Violence Risk No          Review of Systems   Review of Systems   Constitutional: Negative.    HENT: Negative.    Respiratory: Negative.    Cardiovascular: Negative.    Gastrointestinal: Positive for abdominal distention.   Genitourinary: Positive for vaginal bleeding and vaginal pain.   Musculoskeletal: Negative.    Skin: Negative.    Hematological: Negative.    Psychiatric/Behavioral: Negative.        Physical Exam     ED Triage Vitals   BP Heart Rate Heart Rate (via Pulse Ox) Resp Temp  Temp src SpO2 O2 Device O2 Flow Rate   06/26/15 1030 06/26/15 1030 -- 06/26/15 1030 06/26/15 1030 -- 06/26/15 1030 -- --   124/76 98  18 36.6 C (97.9 F)  100 %        Weight           06/26/15 1030           80.3 kg (177 lb)               Physical Exam   Constitutional: She is oriented to person, place, and time. She appears well-developed and well-nourished.   HENT:   Head: Normocephalic and atraumatic.   Right Ear: External ear normal.   Left Ear: External ear normal.   Nose: Nose normal.   Mouth/Throat: Oropharynx is clear and moist.   Eyes: Conjunctivae and EOM are normal. Pupils are equal, round, and reactive to light.   Neck: Normal range of motion. Neck supple.   Cardiovascular: Normal rate, regular rhythm, normal heart sounds and intact distal pulses.    Pulmonary/Chest: Effort normal and breath sounds normal.   Abdominal: Soft. Bowel  sounds are normal. She exhibits no mass. There is no tenderness. There is no rebound and no guarding.   Musculoskeletal: Normal range of motion.   Neurological: She is alert and oriented to person, place, and time.   Skin: Skin is warm and dry.   Psychiatric: She has a normal mood and affect. Her behavior is normal. Judgment and thought content normal.   Nursing note and vitals reviewed.       Medical Decision Making        Initial Evaluation:  ED First Provider Contact     Date/Time Event User Comments    06/26/15 [phone] ED Provider First Contact Pecolia Marando H Initial Face to Face Provider Contact          Patient seen by me today 06/26/2015 at at time of arrival  10:19 AM.  Initial face to face evaluation time noted above may be discrepant due to patient acuity and delay in documentation.    Assessment:  39 y.o.female comes [facility] with Abd bloating x 5 years, vaginal irrit    Differential Diagnosis includes Yeast infection, gastroparesis, Food sensitivity                Plan: Check swabs for BV, yeast, trich, STD--exam deferred. F/u with GI. Diflucan x  1.    Final Diagnosis  Final diagnoses:   [K31.84] Gastroparesis (Primary)   [B37.49] Candida infection of genital region           Orpah Cobb, MD       Orpah Cobb, MD  06/26/15 [phone]

## 2015-06-27 ENCOUNTER — Other Ambulatory Visit: Payer: Self-pay | Admitting: Emergency Medicine

## 2015-06-27 LAB — VAGINITIS SCREEN: DNA PROBE: Vaginitis Screen:DNA Probe: POSITIVE

## 2015-06-27 MED ORDER — METRONIDAZOLE 500 MG PO TABS *I*
500.0000 mg | ORAL_TABLET | Freq: Two times a day (BID) | ORAL | 0 refills | Status: DC
Start: 2015-06-27 — End: 2015-08-04

## 2015-06-29 ENCOUNTER — Telehealth: Payer: Self-pay

## 2015-06-29 LAB — CHLAMYDIA PLASMID DNA AMPLIFICATION: Chlamydia Plasmid DNA Amplification: 0

## 2015-06-29 LAB — TRICHOMONAS DNA AMPLIFICATION: Trichomonas DNA amplification: 0

## 2015-06-29 LAB — N. GONORRHOEAE DNA AMPLIFICATION: N. gonorrhoeae DNA Amplification: 0

## 2015-06-29 NOTE — Telephone Encounter (Signed)
Writer attempted to contact patient regarding lab results by calling phone number on file and was proclaimed wrong number by person on other end. No other phone number listed.

## 2015-07-01 ENCOUNTER — Telehealth: Payer: Self-pay | Admitting: Nurse Practitioner

## 2015-07-01 NOTE — Telephone Encounter (Signed)
Pt. Was attempted to be called but wrong number listed, no other phone number available.Parks NeptuneMegan M Merissa Renwick, RN

## 2015-07-01 NOTE — Telephone Encounter (Signed)
-----   Message from Orpah CobbMichael Hannon Loeb, MD sent at 06/27/2015  9:17 AM EST -----  Please call and inform patient she has BV. I will send Rx to pharmacy.

## 2015-07-06 ENCOUNTER — Telehealth: Payer: Self-pay

## 2015-07-06 NOTE — Telephone Encounter (Signed)
Called patient to schedule NPV due to referral received. No answer. Left VM to call back. Mailed letter.

## 2015-07-12 NOTE — Telephone Encounter (Signed)
Spoke with patient and she [facility] Radene Kneeottie Mason. Patient confirmed. Requested ASL interpreter. Mailed NPP.

## 2015-07-12 NOTE — Telephone Encounter (Signed)
Ms. Leslie Holder is returning a call from [place]. She states this is regarding scheduling a NPV. She is requesting a call back at [phone].    Connected to RIM, CoatsNatalie.

## 2015-07-22 NOTE — Telephone Encounter (Signed)
[facility] ED/UC Post-Visit Patient Call     Visit location: [facility]  Date of service: 06/26/2015    Condition of patient: NA    Patient seeking follow-up with PCP or specialist: NA    Prescriptions filled: n/a    Did ED/UC staff take the time to listen to patient concerns: n/a    Did ED/UC staff keep the patient informed: n/a    Other questions/concerns voiced by patient: PAST 5 DAY CALL BACK PERIOD    MyChart-offer to assist patient with instructions: n/a    Terrall Laity on 07/22/2015 at 11:21 AM

## 2015-08-04 ENCOUNTER — Encounter: Payer: Self-pay | Admitting: Gastroenterology

## 2015-08-04 ENCOUNTER — Ambulatory Visit
Admission: RE | Admit: 2015-08-04 | Discharge: 2015-08-04 | Disposition: A | Discharge status: Home / Self Care | Payer: Self-pay | Attending: Pediatric Gastroenterology | Admitting: Pediatric Gastroenterology

## 2015-08-04 ENCOUNTER — Ambulatory Visit: Payer: Self-pay

## 2015-08-04 VITALS — BP 127/85 | HR 96 | Ht 65.0 in | Wt 172.5 lb

## 2015-08-04 DIAGNOSIS — R140 Abdominal distension (gaseous): Secondary | ICD-10-CM

## 2015-08-04 DIAGNOSIS — K5900 Constipation, unspecified: Secondary | ICD-10-CM

## 2015-08-04 NOTE — H&P (Signed)
Robertine Kipper  [id number]     08/04/2015  Orpah Cobb, MD  [address]  [address]  [place], [place] [address]        It is a pleasure to see Leslie Holder in our Gastroenterology clinic. She is a 40 y.o. female referred by PCP for abdominal bloating.   PMH:   Fibroids removed.  S/p appendectomy.  Patient is deaf and sign language interpreter used.     HPI:  At age 34 in [place], she was diagnosed with gastric ulcers but no procedures done.  She changed her diet and stopped alcohol, with symptoms improved.    CC:  Chronic abdominal bloating since moving to the [place].  The bloating is chronic throughout the day.  Not bloated when she wakes up in the morning.    No abdominal pain.    She has a good appetite, trying to eat right.  Denies GERD symptoms.    Triggers:  Spicy, milk with indigestion without diarrhea.   BMs once every couple days, occasional hard stools, no melena or hematochezia.  She has not used OTC medicaitons.   Weight has been stable over thel ast few years.   No workup.      Allergies/Sensitivities:No Known Allergies (drug, envir, food or latex)    Medications:   None.      Past Medical Hx:   Past Medical History   Diagnosis Date    Hearing impaired person      Past Surgical Hx:    Past Surgical History   Procedure Laterality Date    Appendectomy       Family Hx:   Family History   Problem Relation Age of Onset    No Known Problems Mother     No Known Problems Father      Social Hx:  Moved to [place] from [place], [place] in 2011.  2 children.  Denies alcohol, smoking or drug use.      ROS: General:  No malaise, fatigue, fever, chills, sweats.  HEENT:  No tinnitus, coryza, diplopia, dysgeusia.  Cardiovascular:  No chest pain, palpitations.  Respiratory:  No dyspnea, cough.  Musculoskeletal:  No myalgias.  GI:  See above.  GU:  No dysuria, hematuria.  Skin:  No rash, pruritus, jaundice.  Neuro:   No focal numbness, weakness, tremor.  Psychiatric:  No somnolence, confusion, dysphoria.  Endocrine:  No heat or  cold intolerance.  Heme/Lymph:  No easy bruising/bleeding.  No concerning lumps.  Allergy/Rheum:  No arthralgias/arthritis.  No rash/hives.     Vitals:   Vitals:    08/04/15 1116   BP: 127/85   Pulse: 96   Weight: 78.2 kg (172 lb 8 oz)   Height: 165.1 cm ( )     Body mass index is 28.71 kg/(m^2).    Physical Exam: Healthy appearing female in no acute distress.  Skin:  No rashes, jaundice, spider angiomata, palmar erythema, or telangiectasias.  Warm and dry.   Lymphatics:  No peripheral adenopathy palpated in SCF or cervical chains.    HEENT:  NC/AT, EOMI, PERRLA, no glossitis, cheilosis, or icterus.     Neck:  No masses, tracheal deviation, thyromegaly, or thyroid tenderness.    Lungs:  Clear bilaterally to auscultation. Normal respiratory effort.    Cor:  RRR without murmur, rub, or gallop.  No heave or thrill.    Abdomen:  Normal bowel sounds, no bruits.  No distention.  Tender over LUQ.  Tender over RLQ surgical scar.  No hepatosplenomegaly, masses, aneurysms, hernias.    Rectum: Deferred, as it was not pertinent to this evaluation.   Extremities:  Warm, good capillary refill of nail beds, no edema.  Neuro:  Alert and oriented x 3 with appropriate affect.  Ambulatory:  No gross motor deficits or ataxia/dysarthria noted.    Breast/pelvic:  Examination deferred to primary care physician or gynecologist.    Impression(s)/Recommendation(s):   Chronic bloating over the last 5 plus years since moving to the [place] from [place].  She reports an empirical diagnosis of stomach ulcers in her teenage years.  She had mild tenderness in LUQ on exam.  She also has mild constipation without other concerning symptoms.      Recommendations:  - Endoscopy - evaluate for PUD, h. Pylori gastritis.   - Will await endoscopy results before considering PPI therapy.    - Consider lactose intolerance.  She will try avoding dairy products for a week.   - Gas and bloating diet sheet provided.   - Control constipation.  Start with Colace 2  tablets daily.     RTC in 6-8 weeks.     Thank you for the opportunity of seeing this patient.  Please do not hesitate to contact us for any questions/concerns at [phone].    Effie Shy, NP

## 2015-08-04 NOTE — Discharge Instructions (Signed)
Abdominal bloating and left side pain  - endoscopy  - consider avoiding all milk products for a week.     Constipation  - Colace (docusate sodium) 100 mg tablets - you can buy over the counter.  Start with 2 a day, may increase of decrease to have regular daily bowel movements.      ENDOSCOPY INSTRUCTIONS [facility])    Thank you for choosing [facility] for your endoscopic procedure.  In an effort to provide you with the best possible exam, we will need your assistance with the following instructions    If you believe you may be pregnant or need to cancel your appointment for any reason, please call our office at least 48 hours prior to your scheduled appointment at [phone].    If you take any of the following medications, you must be seen in the office at least two weeks BEFORE your procedure, in order to receive careful instructions on how to manage your medications.    Coumadin (warfarin)    Plavix (clopidogrel)   Ticlid (ticlopidine)    Lovenox (enoxaparin)        Heparin    Aggrenox (aspirin and dipyridamole)  Effient (prasugrel)    Additionally, if you have any of the conditions listed immediately below, then you must be seen in the office at least two weeks BEFORE your procedure, in order to receive careful evaluation and instructions.     If you take any drug for the purpose of being anti-coagulated, keeping your blood thin, or preventing blood clots;    If you are being treated or have been treated for stroke or transient ischemic attacks (TIAs), or mini-strokes;   If you have artificial heart valves, or coronary artery or carotid artery stents     If you take any medications for diabetes, by injection or oral tablets, then you must be seen in the office at least two weeks BEFORE your procedure, in order to receive careful instructions on how to manage these medications.  Examples of medication  - Glucophage,  Metformin, glipizide, actos, Lantus,  Humulin, or any other insulin.    Please note that aspirin is not longer routinely discontinued prior to our procedures, unless you receive specific instructions to do so.     Unless otherwise instructed, continue to take all of your usual medications on your regular schedule.  If you have an early morning procedure, please bring your medications with you to take after the procedure.     If you need an office appointment, or if you need to review your instructions, please call   [phone] as soon as possible.    ON THE DAY OF YOUR PROCEDURE:      Unless directed otherwise, you may have a clear liquid diet (only clear liquids, no solid food) up to 4 hours before you are scheduled to arrive for your procedure.     YOU MAY HAVE (examples):        Do NOT have (examples):                       Clear soda (ginger ale, Sprite) Red or purple Jell-O                       Clear juice (apple, white cranberry) Beef broth  Gatorade/Powerade (clear or yellow) Alcohol                       Chicken broth (nothing in it) Milk or milk products                       Black Coffee/Tea (no cream) Yogurt or pudding                       Jell-O (yellow, orange, green) Fudgsicles                       Popsicles (not red or purple) Cream soups.      Please arrive on time.  Allow extra time in your trip for weather, traffic and parking.  Parking is validated for the hospitals garage at the time of your discharge.   Please bring a list of all the medications you take, including dosages and how often each medication is taken.   Also list any herbal or vitamin supplements you take.  When you check in you will be asked if you have brought that list, if you have not, we will ask you to create the medication list at that time.  Having an up to date list of all your medications helps [place] provide safe patient care.     Personal belongings:   On the day of your sedated procedure, we strongly recommend       that you leave  valuables (money/jewelry) at home or give them to a family member or friend for safekeeping.  However, a small secure storage area is available on the unit for storage of your valuables if necessary.  Please alert the nurse in the admitting area if you need to use the secure storage area.     While endoscopy is a generally safe procedure, there is a small chance of developing complications that may not be identified up to a week or later after your procedure.  We suggest that you do not plan this procedure within a couple of weeks prior to traveling, or an important social event.      AFTER YOUR PROCEDURE:     A responsible person must pick you up after your procedure to accompany you home, as you will have been sedated and will not be allowed to drive home.    REMEMBER: You may not drive, work, or engage in important decisions (e.g. financial issues) for the rest of the day after your procedure.   Your specific discharge instructions will be reviewed with you after your procedure.     A copy of the report and results will automatically be sent to your Primary Care Physician and the referring physician.    If you have questions about these instructions, you may call us at [phone] between 8:30-4P Monday through Friday and we will be glad to answer them for you.      The [place] [facility] has joined hundreds of health care institutions around the nation that have become Smoke-Free throughout their campuses.  We respectfully request that our patients and visitors refrain from smoking while at the [facility].      Directions to the [place] [facility] [place] [facility] G[facility]atolog[facility] Offices and [place] at [address], [address], [place], [place] [address]  From the Thruway   From either the westbound or the eastbound lanes, exit at EXIT 46. Follow the directions to [place]. Follow  Route [place] to Exit [address]). Turn right onto [address]. Turn right at the [place]. intersection. There is a small entrance to the [place], [address] Next Right, on the right as you proceed easterly on [place]. You can enter here, ONLY if proceeding easterly on [address], or you can turn right at the traffic light at the intersection of [place]. and [address].   From the [place] to Exit [address]). Turn right onto [address]. Turn right at the [place]. intersection. There is a small entrance to the [place], [address] Next Right, on the right as you proceed easterly on [place]. You can enter here, ONLY if proceeding easterly on [address], or you can turn right at the traffic light at the intersection of [place]. and [address].   Alternately, proceed [place] on [place]. (Route 15) or proceed [place] on [address]. (Route 15A) until you reach [address]. Turn right on [address] There is a small entrance to the [place], [address] Next Right, on the right as you proceed easterly on [place]. You can enter here, ONLY if proceeding easterly on [address], or you can turn right at the traffic light at the intersection of [place]. and [address].   From the [place], then take [place] to Exit [address]). Turn left onto [address]. Turn right at the [place]. intersection. There is a small entrance to the [place], [address] Next Right, on the right as you proceed easterly on [place]. You can enter here, ONLY if proceeding easterly on [address], or you can turn right at the traffic light at the intersection of [place]. and [address].   From the [place] or [place]  to [place]. intersection. Turn right onto [address]. You must turn left at the traffic light and follow the circle around in order to enter the [address] site. Again, you must use the traffic light entrance to access [address].   Alternately, follow Route [place] until it becomes [address]. Follow Route [place] to Exit 16 ([place] / [address]. ). Turn right onto [address]. Turn right at the [place]. intersection. There is a small entrance to the [place], [address] Next Right, on the right as you proceed easterly on [place]. You can enter here, ONLY if proceeding easterly on [address], or you can turn right at the traffic light at the intersection of [place]. and [address].   From the [place] until [place]. Turn left on [address]. . Turn right onto [address]. You must turn left at the traffic light and follow the circle around in order to enter the [address] site. Again, you must use the traffic light entrance to access [employer].

## 2015-09-03 ENCOUNTER — Encounter: Payer: Self-pay | Admitting: Primary Care

## 2015-09-03 ENCOUNTER — Ambulatory Visit: Payer: Self-pay | Admitting: Primary Care

## 2015-09-14 ENCOUNTER — Telehealth: Payer: Self-pay

## 2015-09-14 NOTE — Telephone Encounter (Signed)
[facility] ED/UC Post-Visit Patient Call     Visit location: [facility]  Date of service: 06/26/2015    Condition of patient: n/a    Patient seeking follow-up with PCP or specialist: n/a    Prescriptions filled: n/a    Did ED/UC staff take the time to listen to patient concerns: n/a    Did ED/UC staff keep the patient informed: n/a    Other questions/concerns voiced by patient: n/a    MyChart-offer to assist patient with instructions: n/a    PAST 5 DAY CALL BACK PERIOD    Leslie MelnickMary Ellaree Holder on 09/14/2015 at 12:54 PM

## 2015-10-04 ENCOUNTER — Encounter: Payer: Self-pay | Admitting: Gastroenterology

## 2015-10-04 ENCOUNTER — Ambulatory Visit: Payer: Self-pay

## 2015-10-04 ENCOUNTER — Ambulatory Visit
Admission: RE | Admit: 2015-10-04 | Discharge: 2015-10-04 | Disposition: A | Discharge status: Home / Self Care | Payer: Self-pay | Attending: Gastroenterology | Admitting: Gastroenterology

## 2015-10-04 MED ORDER — MIDAZOLAM HCL 5 MG/5ML IJ SOLN *I*
INTRAMUSCULAR | Status: AC
Start: 2015-10-04 — End: 2015-10-04
  Filled 2015-10-04: qty 5

## 2015-10-04 MED ORDER — MEPERIDINE HCL 50 MG/ML IJ SOLN *WRAPPED*
INTRAMUSCULAR | Status: AC
Start: 2015-10-04 — End: 2015-10-04
  Filled 2015-10-04: qty 2

## 2015-10-04 MED ORDER — MIDAZOLAM HCL 5 MG/5ML IJ SOLN *I*
INTRAMUSCULAR | Status: AC
Start: 2015-10-04 — End: 2015-10-04
  Filled 2015-10-04: qty 10

## 2015-10-04 MED ORDER — SODIUM CHLORIDE 0.9 % IV SOLN WRAPPED *I*
100.0000 mL/h | Status: DC
Start: 2015-10-04 — End: 2015-10-05
  Administered 2015-10-04: 100 mL/h via INTRAVENOUS

## 2015-10-04 MED ORDER — DIPHENHYDRAMINE HCL 50 MG/ML IJ SOLN *I*
INTRAMUSCULAR | Status: AC
Start: 2015-10-04 — End: 2015-10-04
  Filled 2015-10-04: qty 1

## 2015-10-04 MED ORDER — MIDAZOLAM HCL 5 MG/5ML IJ SOLN *I*
INTRAMUSCULAR | Status: AC | PRN
Start: 2015-10-04 — End: 2015-10-04
  Administered 2015-10-04: 1 mg via INTRAVENOUS
  Administered 2015-10-04: 2 mg via INTRAVENOUS
  Administered 2015-10-04: 1 mg via INTRAVENOUS

## 2015-10-04 MED ORDER — MEPERIDINE HCL 50 MG/ML IJ SOLN *WRAPPED*
INTRAMUSCULAR | Status: AC | PRN
Start: 2015-10-04 — End: 2015-10-04
  Administered 2015-10-04: 50 mg via INTRAVENOUS
  Administered 2015-10-04: 25 mg via INTRAVENOUS

## 2015-10-04 NOTE — Discharge Instructions (Signed)
GASTROENTEROLOGY UNIT  DISCHARGE INSTRUCTIONS FOR GASTROSCOPY      10/04/2015                9:36 AM    EGD and Biopsies taken     Do not drive, operate heavy machinery, drink alcoholic beverages, make important personal or business decisions, or sign legal documents until the next day.    Return to your usual medications   Return to your usual diet    Things You May Expect:   A mild sore throat (Warm liquids or lozenges will soothe the throat)   You were given medication to help you relax during the test. You need to rest at home for at least 4-6 hours.    You Should Call Your Doctor For Any of the Following:   Fever, abdominal or chest pain that does not go away   Cough or trouble breathing   Bloody or black stools.   Pain or redness at the IV site    If you have a serious problem after hours.  Call [phone] to reach the GI physician on call.    If you are unable to reach your doctor, [facility] the [facility].    Follow Up Care:   Follow-up in GI clinic with Effie Shyorothy L Mason, NP as scheduled.    New Prescriptions    No medications on file

## 2015-10-04 NOTE — Procedures (Addendum)
EGD Procedure Note   Date of Procedure: 10/04/2015    Referring Physician: Unknown, Provider   Primary Care Provider: Unknown, Provider   Attending Physician: Quincy SimmondsArthur Jaja Switalski, MD  Fellow: Gillie MannersLaura Frado, MD  Indication(s): Abdominal pain, bloating, nausea    Medications: Meperidine 75 mg IV, Midazolam 4 mg IV and Oxygen therapy were administered incrementally over the course of the procedure to achieve an adequate level of conscious sedation.   Moderate Sedation Face Times  Start Time: 0926  End Time: 0939  Duration (minutes): 13 Minutes   Assessment, ordering, supervision and monitoring of moderate sedation was performed by the endoscopy attending throughout the case from the first dose of sedation until patient left procedural area.  I was present throughout the entire moderate sedation.  Quincy SimmondsARTHUR Aalayah Riles, MD    Endoscope: [id number]  Accessories:Biopsy forceps: Regular cold biopsy     Procedure Description: Full disclosure of risks were reviewed with patient as detailed on the consent form. The patient was placed in the left lateral decubitus position and monitored with continuous pulse oximetry and ECG tracing, interval blood pressure monitoring and direct observations.   After adequate sedation, the endoscope was carefully introduced into the oropharynx and passed in to the esophagus under direct visualization. The esophagus and GE junction were carefully examined, including a measurement of the Z-line at 42 cm, GEJ at 42 cm and hiatus at 42 cm from the incisors. After advancement of the endoscope into the stomach, a careful examination was performed, including views of the antrum, incisura angularis, corpus and retroflexed views of the cardia and fundus.   The pylorus was then intubated without any difficulty and the endoscope was advanced to the second portion of the duodenum. Careful examination of the second portion of the duodenum and the bulb was then performed.  The stomach was then decompressed and the endoscope  withdrawn.   Findings and intervention(s) are detailed below. The patient was recovered in the GI recovery area    Findings:     Esophagus:   Normal      Stomach:   Normal mucosa, no ulcers, erosions.    Normal retroflex   bxs to rule out Hp      Duodenum/small bowel:   Normal appearing mucosa no ulcers or erosions noted.     Intervention(s):   Cytology/biopsy: Biopsies taken of stomach to assess HP     Complication(s):   none    Impression(s):  1. Normal examination, with no ulcers or erosions. Biopsies taken to assess for H Pylori.     Recommendation(s):   Await pathology results   Follow-up in GI clinic with Effie Shyorothy L Mason, NP as scheduled    Consider trial of H2 blocker or PPI     Histopathologic Diagnosis: - will direct MLP to treat this infection.  Stomach, biopsy:   - Antral mucosa with moderate chronic active gastritis.   - Helicobacter pylori are seen on H&E stain.   - Corpus mucosa, within normal limits.   - No intestinal metaplasia.     Gillie MannersLaura Frado, MD     GI ATTENDING  I was present during the entire viewing portion of this endoscopy procedure, including from the moment of scope insertion until the completion of the scope withdrawal.  I agree with the fellow's documentation of the procedure.  Dr. Teresa PeltoneCross

## 2015-10-04 NOTE — Preop H&P (Addendum)
OUTPATIENT PRE-PROCEDURE H&P    Chief Complaint / Indications for Procedure: bloating, abdominal pain. History of GU    Past Medical History:     Past Medical History:   Diagnosis Date    Hearing impaired person      Past Surgical History:   Procedure Laterality Date    APPENDECTOMY       Family History   Problem Relation Age of Onset    No Known Problems Mother     No Known Problems Father      Social History     Social History    Marital status: Divorced     Spouse name: N/A    Number of children: N/A    Years of education: N/A     Social History Main Topics    Smoking status: Never Smoker    Smokeless tobacco: None    Alcohol use No    Drug use: No    Sexual activity: Not Asked     Other Topics Concern    None     Social History Narrative       Allergies:  No Known Allergies (drug, envir, food or latex)    Medications:    (Not in a hospital admission)    No current outpatient prescriptions on file.     Current Facility-Administered Medications   Medication Dose Route Frequency    sodium chloride 0.9 % IV  100 mL/hr Intravenous Continuous     There were no vitals filed for this visit.    ROS    Physical Examination:  Head/Nose/Throat:negative  Lungs:Lungs clear  Cardiovascular:normal S1 and S2  Abdomen: abdomen soft, non-tender, nondistended, normal active bowel sounds, no masses or organomegaly      Lab Results: none    Radiology impressions (last 30 days):  No results found.    Currently Active Problems:  There is no problem list on file for this patient.       Impression:  Upper endoscopy    Plan:  EGD  Moderate Sedation    UPDATES TO PATIENT'S CONDITION on the DAY OF SURGERY/PROCEDURE    I. Updates to Patient's Condition (to be completed by a provider privileged to complete a H&P, following reassessment of the patient by the provider):    Full H&P done today; no updates needed.    II. Procedure Readiness   I have reviewed the patient's H&P and updated condition. By completing and signing this  form, I attest that this patient is ready for surgery/procedure.      III. Attestation   I have reviewed the updated information regarding the patient's condition and it is appropriate to proceed with the planned surgery/procedure.    Gillie MannersLaura Frado, MD as of 8:37 AM 10/04/2015           GI ATTENDING  I saw and evaluated the patient. I agree with the resident's/fellow's findings and plan of care as documented above.    Quincy SimmondsARTHUR Christophor Eick, MD

## 2015-10-05 ENCOUNTER — Encounter: Payer: Self-pay | Admitting: Gastroenterology

## 2015-10-05 ENCOUNTER — Telehealth: Payer: Self-pay

## 2015-10-05 NOTE — Telephone Encounter (Signed)
Post procedure follow up call. Spoke with Leslie Holder via [employer] phone. Patient stated " lack of appetite, but feels fine". Reinforced to patient to call phone number on discharge form for any further questions or concerns in the future.

## 2015-10-07 LAB — SURGICAL PATHOLOGY

## 2015-10-08 ENCOUNTER — Telehealth: Payer: Self-pay | Admitting: Gastroenterology

## 2015-10-08 NOTE — Telephone Encounter (Signed)
Patient called stating she had EGD last week, and wants to know which foods she can eat, and which to avoid.    Please call her back.

## 2015-10-11 MED ORDER — AMOXICILLIN 500 MG PO CAPS *I*
1000.0000 mg | ORAL_CAPSULE | Freq: Two times a day (BID) | ORAL | 0 refills | Status: AC
Start: 2015-10-11 — End: 2015-10-25

## 2015-10-11 MED ORDER — OMEPRAZOLE 20 MG PO CPDR *I*
20.0000 mg | DELAYED_RELEASE_CAPSULE | Freq: Every day | ORAL | 3 refills | Status: AC
Start: 2015-10-11 — End: 2016-04-08

## 2015-10-11 MED ORDER — CLARITHROMYCIN 500 MG PO TABS *I*
500.0000 mg | ORAL_TABLET | Freq: Two times a day (BID) | ORAL | 0 refills | Status: AC
Start: 2015-10-11 — End: 2015-10-25

## 2015-10-11 NOTE — Telephone Encounter (Signed)
Discussed endoscopy with her, and positivie H. Pylori gastritis biopsy.  Recommended she start PrevPac therapy, and prescribed.  Reinforced f/u appt.   She will also call back, I just need her medication list, question if she is already on another antibiotic.

## 2015-10-12 ENCOUNTER — Telehealth: Payer: Self-pay | Admitting: Gastroenterology

## 2015-10-12 NOTE — Telephone Encounter (Signed)
Patient calling in regards to amoxicillin (AMOXIL) 500 MG capsule, clarithromycin (BIAXIN) 500 MG tablet.  Patient is requesting a call back from Ms. Mason at [phone] (H).  Patient is also wanting to discuss homeopathic and holistic intervention to aid in her care in taking care of her infection; as well as any foods she needs to avoid.

## 2015-10-16 NOTE — Progress Notes (Signed)
Patient aware of H. Pylori infection and working with her GI clinician, Radene Kneeottie Mason NP,  on therapy.

## 2015-11-01 ENCOUNTER — Telehealth: Payer: Self-pay | Admitting: Gastroenterology

## 2015-11-01 NOTE — Telephone Encounter (Signed)
Spoke with patient and she is rescheduled for 11[facility]09/15 [facility]. ASL interpreter has been requested. Patient confirmed.

## 2015-11-01 NOTE — Telephone Encounter (Signed)
Ms. Leslie Holder is calling to cancel her appointment which is currently scheduled for 11/03/15 she states she just started a job and she need to have an appointment after 2pm.    Has the appointment been cancelled? no    Has the appointment been rescheduled? no    Does the patient need a call back to reschedule?yes    Patient can be contacted back at [phone], patient states she may not answer but please leave a detailed message.

## 2015-11-03 ENCOUNTER — Ambulatory Visit: Payer: Self-pay | Admitting: Gastroenterology

## 2015-11-09 ENCOUNTER — Encounter: Payer: Self-pay | Admitting: Gastroenterology

## 2015-11-09 ENCOUNTER — Ambulatory Visit
Admission: RE | Admit: 2015-11-09 | Discharge: 2015-11-09 | Disposition: A | Discharge status: Home / Self Care | Payer: Self-pay | Attending: Pediatric Gastroenterology | Admitting: Pediatric Gastroenterology

## 2015-11-09 VITALS — BP 134/77 | HR 78 | Ht 65.0 in | Wt 181.0 lb

## 2015-11-09 DIAGNOSIS — E739 Lactose intolerance, unspecified: Secondary | ICD-10-CM

## 2015-11-09 DIAGNOSIS — B9681 Helicobacter pylori [H. pylori] as the cause of diseases classified elsewhere: Secondary | ICD-10-CM

## 2015-11-09 DIAGNOSIS — K2970 Gastritis, unspecified, without bleeding: Secondary | ICD-10-CM

## 2015-11-09 NOTE — Discharge Instructions (Signed)
H. Pylori gastritis.  - you have completed the antibiotics for it.   - Please submit a stool sample to see if the bacteria is gone.   You must stop Prilosec 2 weeks before submitting the stool sample.      Lactose intolerance -  The word lactose means cow's milk.  - follow a lactose free diet to see if this helps.    Read labels to see if the word "lactose" is on it.      We can check for other causes in follow up visits.

## 2015-11-09 NOTE — Progress Notes (Signed)
Estrellita Lasky  [id number]     11/09/2015  Unknown, Provider  NONE  PHONE #, [phone]        It is a pleasure to see Leslie Holder in our Gastroenterology clinic. She is a 40 y.o. female here for follow up abdominal bloating pain.   PMH:  Deaf.  Sign language interpreter services used.      She was last seen in our GI clinic on 08/04/2015 for chronic abdominal bloating.    - EGD of 10/2015 was normal, but biopsies were consistent with H. Pylori gastritis.   She completed a 14 day course of PrevPac.  She is still on omeprazole 20 mg daily.     Today in the clinic, her appetite is good.  She has no GERD symptoms.  No nausea.  No abdominal pain.  She still has some mild bloating.    Ice cream and dairy products causes indigestion and bloating.    BMS are every 1-2 days, formed stool.    When she sleeps, she has abdominal discomfort when lying supine.      Allergies/Sensitivities:No Known Allergies (drug, envir, food or latex)    Medications:      Medication Sig Start Date End Date Taking? Authorizing Provider   omeprazole (PRILOSEC) 20 MG capsule Take 1 capsule (20 mg total) by mouth daily (before breakfast) 10/11/15 04/08/16  Effie Shy, NP   clindamycin (CLEOCIN) 300 MG capsule Take 300 mg by mouth 3 times daily    [provider]     Past Medical/Surgical History:  Reviewed, per historical notes    ROS: See above HPI for pertinent positives and negatives.      Vitals:   Vitals:    11/09/15 1500   BP: 134/77   Pulse: 78   Weight: 82.1 kg (181 lb)   Height: 165.1 cm ( )     Body mass index is 30.12 kg/(m^2).    Physical Exam: Healthy appearing female in no acute distress.  Skin: No jaundice, rash, or ulcers.    Lungs: Normal respiratory effort, clear bilaterally to auscultation.    Cor: RRR without murmur.    Abdomen: NABS, no distention or tenderness. No hepatosplenomegaly, hernias, bruits, masses.    Rectal: Deferred to primary care physician, not relevant to this evaluation.    Extr: Warm, well-perfused. No  edema, cords, or clubbing.    Neuro: A and O x 3.  Grossly no focal motor deficits    Labs/Imaging:  October 04, 2015 endoscopy impression:  1. Normal examination, with no ulcers or erosions. Biopsies taken to assess for H Pylori.   FINAL DIAGNOSIS:   Stomach, biopsy:   - Antral mucosa with moderate chronic active gastritis.   - Helicobacter pylori are seen on H&E stain.   - Corpus mucosa, within normal limits.   - No intestinal metaplasia.         Impression(s)/Recommendation(s):   H. Pylori gastritis diagnosed by endoscopy of 10/04/2015.  She has completed the antibiotic therapy.  She has some improvement of abdominal bloating discomfort, no GERD symptoms or abdominal pain.  She has clinical symptoms consistent with lactose intolerance.    She is will to try the diet first, before considering other causes for her bloating.     Recommendations:  - check a H. Pylori stool antigen for proven erradicaiton.  She will stop all PPI therapy for 2 weeks prior to that.  - lactose free diet advised.     RTC in 2  months.      Thank you for the opportunity of seeing this patient.  Please do not hesitate to contact us for any questions/concerns at [phone].    Effie ShyROTHY L Embrie Mikkelsen, NP

## 2015-11-30 ENCOUNTER — Ambulatory Visit: Payer: Self-pay

## 2016-01-05 ENCOUNTER — Telehealth: Payer: Self-pay | Admitting: Gastroenterology

## 2016-01-05 NOTE — Telephone Encounter (Signed)
Ms. Leslie Holder is calling to cancel her appointment which is currently scheduled for 01-19-16.    Has the appointment been cancelled? yes    Has the appointment been rescheduled? no    Does the patient need a call back to reschedule?yes    Patient can be contacted back at [phone] after 2:00pm    Writer offered next available but was to far out, patient declined would like a appointment around the same time frame.

## 2016-01-07 NOTE — Telephone Encounter (Signed)
Returned call to patient to reschedule next available FUV with Dottie. [facility], only available in [place] at this time. Left VM to call back.

## 2016-01-11 ENCOUNTER — Telehealth: Payer: Self-pay | Admitting: Gastroenterology

## 2016-01-11 NOTE — Telephone Encounter (Signed)
Patient is schedule for a FUV with D. Mason on 04/17/16 and she needs a sign language interpreter. The patient can be reached if necessary at [phone]

## 2016-01-19 ENCOUNTER — Ambulatory Visit: Payer: Self-pay | Admitting: Gastroenterology

## 2016-01-20 NOTE — Telephone Encounter (Signed)
No call to pt from GI.Marland Kitchen.Marland Kitchen.Marland Kitchen.Please relay if pt calls back

## 2016-01-20 NOTE — Telephone Encounter (Signed)
Patient returning a call from someone but is not sure what it was regarding. She thinks it was regarding an appointment. She can be reached at [phone].

## 2016-02-04 ENCOUNTER — Telehealth: Payer: Self-pay | Admitting: Gastroenterology

## 2016-02-04 NOTE — Telephone Encounter (Signed)
Please advise 

## 2016-02-04 NOTE — Telephone Encounter (Signed)
Leslie Holder has a scheduled appointment on 8/30 with Amarilys Moulds. The patient would like a sooner appointment if possible to be seen for FUV. Patient is experiencing throat pain with some difficulty breathing and may possibly go to Emergency today if needed. Patient can be reached at [phone].

## 2016-02-04 NOTE — Telephone Encounter (Signed)
Called pt with use of sign language interpreter services. Patient stated that she has throat pain and difficulty breathing.  Unfortunately, I was unable to get more information because the patient had poor service and was getting disconnected. Interpreter made several attempts to call patient back but it kept going to mailbox. I left a message w/ asking her to go to the ER for evaluation.  I did let her know dottie would be back on Monday if she needed to speak with her.

## 2016-02-05 ENCOUNTER — Emergency Department
Admission: EM | Admit: 2016-02-05 | Disposition: A | Discharge status: Home / Self Care | Payer: Self-pay | Source: Ambulatory Visit

## 2016-02-05 ENCOUNTER — Encounter: Payer: Self-pay | Admitting: Oncology

## 2016-02-05 LAB — HM HIV SCREENING OFFERED

## 2016-02-05 LAB — RAPID STREP SCREEN: Rapid Strep Group A Throat: 0

## 2016-02-05 NOTE — ED Provider Notes (Signed)
History     Chief Complaint   Patient presents with    Sore Throat     HPI Comments: Leslie Holder is a 40 y.o. female with a history of Past Medical History:  No date: Hearing impaired person with Patient presents with:  Sore Throat  .    Via sign language interpretor.    40 yo female, hearing impaired female presents into the ED with a sore throat for 6 months. She has been evaluated by ENT and GI without answers. Per notes she was diagnosed with gastroparesis and candida in December 2016, in April she had EGD by GI showing HPylori. The paitent also stated she was evaluated by endoscoped by ENT (unable to find in chart an ENT note).The patient stated she was fully treated for both. A plane film of neck was also completed at [facility]. The patient looks well, VS stable. She is asking what is causing her sore throat.     Patient is a 40 y.o. female presenting with pharyngitis.   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 xrays, EDG, dx wiht gastroparesis and candida and H pylori   Associated symptoms: shortness of breath and trouble swallowing    Associated symptoms: no abdominal pain, no adenopathy, no chest pain, no chills, no cough, no drooling, no ear discharge, no ear pain, no fever, no headaches, no neck stiffness, no night sweats, no postnasal drip, no sinus congestion and no stridor    Risk factors: no sick contacts      Past Medical History:   Diagnosis Date    Hearing impaired person         Past Surgical History:   Procedure Laterality Date    APPENDECTOMY      MYOMECTOMY       Family History   Problem Relation Age of Onset     No Known Problems Mother     No Known Problems Father        Social History    reports that she has never smoked. She does not have any smokeless tobacco history on file. She reports that she does not drink alcohol or use illicit drugs. Her sexual activity history is not on file.    Living Situation     Questions Responses    Patient lives with Family    Homeless No    Caregiver for other family member No    External Services None    Employment Unemployed    Domestic Violence Risk No          Problem List   There is no problem list on file for this patient.      Review of Systems   Review of Systems   Constitutional: Negative for chills, fever and night sweats.   HENT: Positive for trouble swallowing. Negative for drooling, ear discharge, ear pain and postnasal drip.    Respiratory: Positive for shortness of breath. Negative for cough and stridor.    Cardiovascular: Negative for chest pain.   Gastrointestinal: Negative for abdominal pain.  Musculoskeletal: Negative for gait problem and neck stiffness.   Neurological: Negative for headaches.   Hematological: Negative for adenopathy.   Psychiatric/Behavioral: The patient is not nervous/anxious.        Physical Exam     ED Triage Vitals   BP Heart Rate Heart Rate (via Pulse Ox) Resp Temp Temp src SpO2 O2 Device O2 Flow Rate   02/05/16 1329 02/05/16 1329 -- 02/05/16 1329 02/05/16 1329 02/05/16 1329 02/05/16 1329 02/05/16 1329 --   130/79 75  18 36.3 C (97.3 F) TEMPORAL 100 % None (Room air)       Weight           02/05/16 1329           78 kg (172 lb)                    Physical Exam   Constitutional: She is oriented to person, place, and time. She appears well-developed and well-nourished. No distress.   HENT:   Mouth/Throat: Oropharynx is clear and moist. No oropharyngeal exudate.   Eyes: Conjunctivae are normal. Right eye exhibits no discharge. Left eye exhibits no discharge. No scleral icterus.   Neck: Normal range of motion. Neck supple.   No exudate, no  redness noted.    Cardiovascular: Normal rate, regular rhythm, normal heart sounds and intact distal pulses.    Pulmonary/Chest: Effort normal and breath sounds normal. No stridor. No respiratory distress. She has no wheezes. She has no rales.   Musculoskeletal: Normal range of motion. She exhibits no edema or tenderness.   Lymphadenopathy:     She has no cervical adenopathy.   Neurological: She is alert and oriented to person, place, and time. Coordination normal.   Skin: Skin is warm and dry. No rash noted. She is not diaphoretic. No erythema. No pallor.   Psychiatric: She has a normal mood and affect. Her behavior is normal. Judgment and thought content normal.   Nursing note and vitals reviewed.      Medical Decision Making      Amount and/or Complexity of Data Reviewed  Tests in the radiology section of CPT: ordered  Discussion of test results with the performing providers: yes        Initial Evaluation:  ED First Provider Contact     Date/Time Event User Comments    02/05/16 1329 ED Provider First Contact [place], [facility] Initial Face to Face Provider Contact          Patient seen by me on arrival date of 02/05/2016 at at time of arrival  1:25 PM. 3 pm.  Initial face to face evaluation time noted above may be discrepant due to patient acuity and delay in documentation.    Assessment:  40 y.o.female comes to the ED with sore throat and noted in past notes from GI she stated she was sob. The patient is not sob presently. Oxygen saturation good, talking in complete sentences. A chest xray was obtained and a rapid strep test. The patient stopped taking omeprazole.. Educated her why she should take it. ENT notes not found in chart--saw ENT in another area.chest xray and rapid strep negative.  I encouraged her to follow up with ENT and to take omeprazole.     Differential Diagnosis includes sore throat, viral pharyngitis, bacterial pharyngitis             Plan:   Sore throat/sob  1. xrays  2. Ibuprofen/Analgesia  3.  Rapid strep test.  4. x-rays are negative discharge and f/u with PCP.  Wannetta Sender, NP    Supervising physician dr Mellissa Kohut* was immediately available     Wannetta Sender, NP  02/07/16 [phone]

## 2016-02-05 NOTE — ED Notes (Addendum)
Appropriate clothing in place. Discharge instructions reviewed and pt expressed understanding, sign language interpreter utilized. Pt is leaving ambulatory. Pt tolerating PO intake. Pt will follow up with PCP. Pt belongings with pt. Pt safe to D/C a this time. Pt driving self home, states OK to drive.

## 2016-02-05 NOTE — ED Notes (Signed)
Assumed care of patient. Pt appears in no acute distress. Sign language interpreter paged. Will continue to monitor and treat per MD orders.

## 2016-02-05 NOTE — ED Triage Notes (Signed)
C/o sore throat and pain with swallowing for the past 8 months. Seen by GI service.       Triage Note   Donna Bernard, RN

## 2016-02-05 NOTE — ED Triage Notes (Signed)
Pt is hearing impaired. C/o       Triage Note   Donna Bernard, RN

## 2016-02-05 NOTE — ED Notes (Signed)
Pt to xray

## 2016-02-05 NOTE — ED Notes (Signed)
Pt states she has had a sore throat and difficulty swallowing for the past 8 months since she was treated for H. Pylori. Pt states she has seen GI multiple time for issues. Pt handling secretions and talking in full sentences. Pt states she is able to eat and drink. Will continue to monitor and treat per MD orders.

## 2016-02-05 NOTE — First Provider Contact (Signed)
ED Medical Screening Exam Note    Initial provider evaluation performed by   ED First Provider Contact     Date/Time Event User Comments    02/05/16 1329 ED Provider First Contact Deyanna Mctier, [facility] Initial Face to Face Provider Contact        40Y hearing impaired female presents with 8 months of worsening throat pain, dysphagia. She has been seen by GI and ENT.    Sign language interpreter used.    Vital signs reviewed.    Orders placed:  No intervention     Patient requires further evaluation.     Virgina Evener, [place], 02/05/2016, 1:30 PM    Supervising physician Dr Drucie Opitz was immediately available     Virgina Evener, [place]  02/05/16 1343

## 2016-02-05 NOTE — Discharge Instructions (Signed)
Patient is to drink fluids--8 glasses of water per day, soft foods, ice cream, then regular diet as tolerated, activity as tolerated. Patient is to follow up with her doctor in the next one to two days.     Patient is to return for fever, chills, nausea, vomiting, worsening difficulty swallowing, or any other concerns.     Patient verbalized understanding of discharge instructions and was discharged home in appropriate clothing.     Strep culture pending.     It was great caring for you today!! Feel better!! Have a wonderful weekend.

## 2016-02-07 LAB — STREP A CULTURE, THROAT: Group A Strep Throat Culture: 0

## 2016-02-14 ENCOUNTER — Ambulatory Visit: Payer: Self-pay

## 2016-02-21 ENCOUNTER — Ambulatory Visit: Payer: Self-pay

## 2016-03-01 ENCOUNTER — Ambulatory Visit: Payer: Self-pay | Admitting: Gastroenterology

## 2016-03-02 ENCOUNTER — Ambulatory Visit: Payer: Self-pay | Admitting: Primary Care

## 2016-04-11 ENCOUNTER — Ambulatory Visit
Admission: AD | Admit: 2016-04-11 | Discharge: 2016-04-11 | Disposition: A | Discharge status: Home / Self Care | Payer: Self-pay

## 2016-04-11 DIAGNOSIS — J209 Acute bronchitis, unspecified: Secondary | ICD-10-CM

## 2016-04-11 MED ORDER — DOXYCYCLINE HYCLATE 100 MG PO TABS *I*
100.0000 mg | ORAL_TABLET | Freq: Two times a day (BID) | ORAL | 0 refills | Status: DC
Start: 2016-04-11 — End: 2016-04-17

## 2016-04-11 MED ORDER — DOXYCYCLINE HYCLATE 100 MG PO TABS *I*
100.0000 mg | ORAL_TABLET | Freq: Two times a day (BID) | ORAL | 0 refills | Status: DC
Start: 2016-04-11 — End: 2016-04-11

## 2016-04-11 NOTE — UC Provider Note (Signed)
History     Chief Complaint   Patient presents with    Cough     Patient is deaf and is here with her children. Her son is translating for her. Patient reports cough with yellow and brown sputum since yesterday. Now with developing sore throat and fatigue.     HPI :   Patient presents today with complaints of cough with productive yellow sputum over the past few days.  She states she does feel somewhat short of breath.  She developing fatigue in addition to a sore throat from coughing.  She has had no chest pain.  No ear pain or sinus congestion.  No nausea vomiting or diarrhea.      Past Medical History:   Diagnosis Date    Hearing impaired person             Past Surgical History:   Procedure Laterality Date    APPENDECTOMY      CESAREAN SECTION, CLASSIC      MYOMECTOMY         Family History   Problem Relation Age of Onset    No Known Problems Mother     No Known Problems Father          Social History    reports that she has never smoked. She has never used smokeless tobacco. She reports that she does not drink alcohol or use illicit drugs. Her sexual activity history is not on file.    Living Situation     Questions Responses    Patient lives with Family    Homeless No    Caregiver for other family member No    External Services None    Employment Unemployed    Domestic Violence Risk No          Review of Systems   Review of Systems   Constitutional: Positive for fever. Negative for chills.   HENT: Positive for congestion and sore throat. Negative for ear pain, postnasal drip, rhinorrhea and sinus pressure.    Eyes: Negative.    Respiratory: Positive for cough and chest tightness. Negative for shortness of breath and wheezing.    Cardiovascular: Negative.    Gastrointestinal: Negative.    Genitourinary: Negative.    Musculoskeletal: Negative.    Skin: Negative.    Neurological: Negative.        Physical Exam     ED Triage Vitals   BP Heart Rate Heart Rate (via Pulse Ox) Resp Temp Temp src SpO2 O2  Device O2 Flow Rate   04/11/16 1726 04/11/16 1726 -- 04/11/16 1726 04/11/16 1726 04/11/16 1726 04/11/16 1726 04/11/16 1726 --   125/81 84  19 36.1 C (97 F) TEMPORAL 100 % None (Room air)       Weight           --                               Physical Exam   Constitutional: She is oriented to person, place, and time. She appears well-developed and well-nourished.   HENT:   Head: Normocephalic and atraumatic.   Right Ear: External ear normal.   Left Ear: External ear normal.   Mouth/Throat: Oropharynx is clear and moist.   Eyes: EOM are normal. Pupils are equal, round, and reactive to light.   Neck: Normal range of motion. Neck supple.   Cardiovascular: Normal rate, regular rhythm and normal heart  sounds.    Pulmonary/Chest: Effort normal. She has no wheezes. She has no rales.   Rhonchi throughout   Abdominal: Soft. Bowel sounds are normal.   Musculoskeletal: Normal range of motion.   Neurological: She is alert and oriented to person, place, and time.   Skin: Skin is warm and dry.   Nursing note and vitals reviewed.       Medical Decision Making        Initial Evaluation:  ED First Provider Contact     Date/Time Event User Comments    04/11/16 1734 ED Provider First Contact Clelia CroftSCOTT, Lorijean Husser A Initial Face to Face Provider Contact          Patient was seen on: 04/11/2016        Assessment:  40 y.o.female comes to the Urgent [facility] Center with Acute bronchitis.    Differential Diagnosis includes Bronchitis, viral upper respiratory illness, pneumonia.                Plan: You were seen today for acute bronchitis.  I did send a prescription to the pharmacy for an antibiotic.  Please take as directed.  Rest, increase fluids.  Over-the-counter Mucinex for the cough.  Follow up with your primary care doctor if your symptoms arent improving.    Final Diagnosis  Final diagnoses:   [J20.9] Acute bronchitis, unspecified organism (Primary)           Clelia CroftKERRY Karlo Goeden, PA    Supervising physician Jamie Brookesarolyn Stern, MD was immediately  available     Clelia CroftScott, Luvinia Lucy, [place]  04/11/16 [phone]

## 2016-04-11 NOTE — Discharge Instructions (Signed)
You were seen today for acute bronchitis.  I did send a prescription to the pharmacy for an antibiotic.  Please take as directed.  Rest, increase fluids.  Over-the-counter Mucinex for the cough.  Follow up with your primary care doctor if your symptoms arent improving.

## 2016-04-11 NOTE — ED Notes (Signed)
Patient discharged to home. Reviewed AVS and discharge instructions. Prescription medication has been ePrescribed to patients pharmacy, patient has been advised to pick them up. All belongings with. Patient has no complaints and is ambulatory at dc. Searra Carnathan L Jakolby Sedivy, RN

## 2016-04-11 NOTE — ED Triage Notes (Signed)
Patient is deaf and is here with her children. Her son is translating for her. Patient reports cough with yellow and brown sputum since yesterday. Now with developing sore throat and fatigue.       Triage Note   Woodward KuBrittany L Rashidi Loh, RN

## 2016-04-14 ENCOUNTER — Telehealth: Payer: Self-pay | Admitting: Gastroenterology

## 2016-04-14 NOTE — Telephone Encounter (Signed)
Left vm to confirm 04/17/16 appt

## 2016-04-17 ENCOUNTER — Ambulatory Visit: Payer: Self-pay

## 2016-04-17 ENCOUNTER — Encounter: Payer: Self-pay | Admitting: Gastroenterology

## 2016-04-17 ENCOUNTER — Ambulatory Visit
Admission: RE | Admit: 2016-04-17 | Discharge: 2016-04-17 | Disposition: A | Discharge status: Home / Self Care | Payer: Self-pay | Attending: Pediatric Gastroenterology | Admitting: Pediatric Gastroenterology

## 2016-04-17 VITALS — BP 128/62 | HR 80 | Wt 175.4 lb

## 2016-04-17 DIAGNOSIS — B9681 Helicobacter pylori [H. pylori] as the cause of diseases classified elsewhere: Secondary | ICD-10-CM

## 2016-04-17 DIAGNOSIS — R1013 Epigastric pain: Secondary | ICD-10-CM | POA: Insufficient documentation

## 2016-04-17 DIAGNOSIS — K2970 Gastritis, unspecified, without bleeding: Secondary | ICD-10-CM

## 2016-04-17 DIAGNOSIS — R140 Abdominal distension (gaseous): Secondary | ICD-10-CM

## 2016-04-17 NOTE — Discharge Instructions (Signed)
-   Ulrasound of abdomen. - 04/26/16 AT 915AM - NOTHING TO EAT OR DRINK AFTER MIDNIGHT - [address]  - Blood work.   - breath testing for lactose intolerance.     -  Stool test to check for H. Pylori gastritis.      After that we will start omeprazole 20 mg daily.     Follow up again in about 2 months.

## 2016-04-17 NOTE — Progress Notes (Signed)
Leslie Holder  [id number]     04/17/2016  Unknown, Provider  NONE  PHONE #, [phone]        It is a pleasure to see Leslie Holder in our Gastroenterology clinic. She is a 40 y.o. female here for follow up abdominal bloating pain.   PMH:  Deaf.  Sign language interpreter services used.      She was last seen in our GI clinic on 08/04/2015 for chronic abdominal bloating.    - EGD of 10/2015 was normal, but biopsies were consistent with H. Pylori gastritis.   She completed a 14 day course of PrevPac and a short course of omeprazole.    At her last visit in May, 2017, she was mostly on a lactose free diet with less symptoms.    Since then she reports continuing abdominal discomfort, hungry feeling, and abdominal bloating.   She has chronic heartburn with indigestion.   She was in ED for acute bronchitis a few months ago..   BMs are normal, usually every 1-2 days.    Weight stable.     Allergies/Sensitivities:No Known Allergies (drug, envir, food or latex)    Medications:       Medication Sig Start Date End Date Taking? Authorizing Provider   doxycycline (VIBRA-TABS) 100 MG tablet Take 1 tablet (100 mg total) by mouth 2 times daily for 7 days 04/11/16 04/18/16  Clelia Croft, PA   doxycycline (VIBRA-TABS) 100 MG tablet Take 1 tablet (100 mg total) by mouth 2 times daily for 7 days 04/11/16 04/18/16  Clelia Croft, PA   omeprazole (PRILOSEC) 20 MG capsule Take 1 capsule (20 mg total) by mouth daily (before breakfast) 10/11/15 04/08/16  Effie Shy, NP     Past Medical/Surgical History:  Reviewed, per historical notes    ROS: See above HPI for pertinent positives and negatives.      Vitals:   Vitals:    04/17/16 1518   BP: 128/62   Pulse: 80   Weight: 79.6 kg (175 lb 6.4 oz)     Body mass index is 29.19 kg/(m^2).    Physical Exam: Healthy appearing female in no acute distress.  Skin: No jaundice, rash, or ulcers.    Lungs: Normal respiratory effort, clear bilaterally to auscultation.    Cor: RRR without murmur.    Abdomen:  NABS, no distention or tenderness. No hepatosplenomegaly, hernias, bruits, masses.    Rectal: Deferred to primary care physician, not relevant to this evaluation.    Extr: Warm, well-perfused. No edema, cords, or clubbing.    Neuro: A and O x 3.  Grossly no focal motor deficits    Labs/Imaging:    no recent labs for review.    Impression(s)/Recommendation(s): chronic dyspepsia and mild bloating in a history of H. pylori gastritis.  She is unsure if she had symptom improvement on omeprazole, but she only took it for 2 weeks.  There are no other warning GI symptoms.  She currently does not have a primary care physician.    Recommendations:  - U/S abdomen - evaluate for biliary pathology.  - Labs for celiac, CBC, chemistry 14, amylase, and lipase.   - lactose breath test  - Stool for H. Pylori stool antigen to prove eradication - she is currently off PPI therapy.  Once this is done, she can restart omeprazole 20 mg daily.    Return to clinic in about 2 months.    Thank you for the opportunity of seeing this patient.  Please  do not hesitate to contact us for any questions/concerns at [phone].    Effie ShyROTHY L Kayli Beal, NP

## 2016-05-05 ENCOUNTER — Ambulatory Visit
Admission: RE | Admit: 2016-05-05 | Discharge: 2016-05-05 | Disposition: A | Discharge status: Home / Self Care | Payer: Self-pay | Source: Ambulatory Visit | Attending: Pediatric Gastroenterology | Admitting: Pediatric Gastroenterology

## 2016-06-08 IMAGING — MR MR SHOULDER*R* W/O CM
4 of 5 series · 19 of 40 positions shown · non-contrast
Comparison: Radiographs dated 04/23/2014

CLINICAL DATA: Right shoulder pain and limited range of motion
since a lifting injury in January 2014.

EXAM:
MRI OF THE RIGHT SHOULDER WITHOUT CONTRAST
TECHNIQUE: Multiplanar, multisequence MR imaging of the shoulder was performed.
No intravenous contrast was administered.

[Series 2: T2 fat-sat · coronal · 4.0mm · 0.27mm/px · 5 of 19 slices shown (1 of 3)]
[im 1/19]
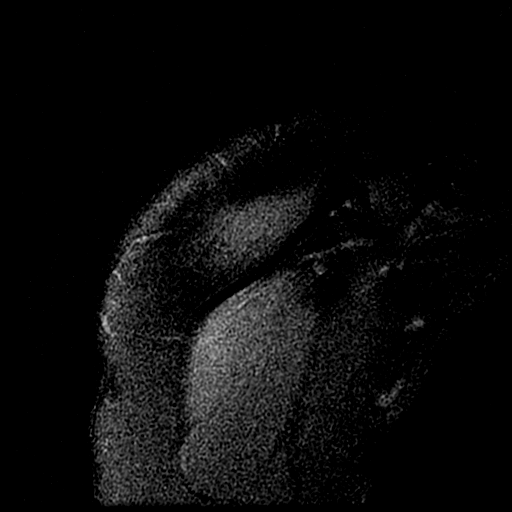
[im 4/19]
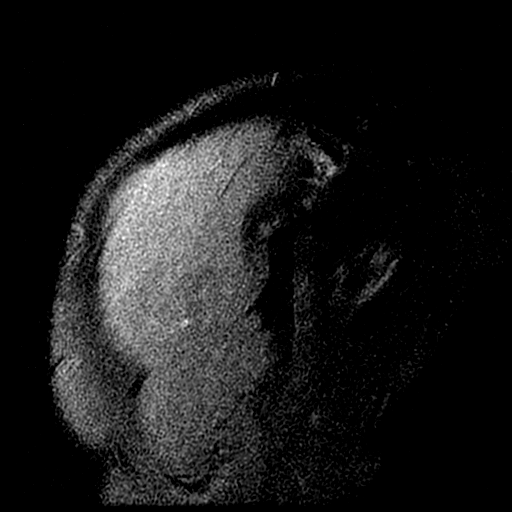
[im 7/19]
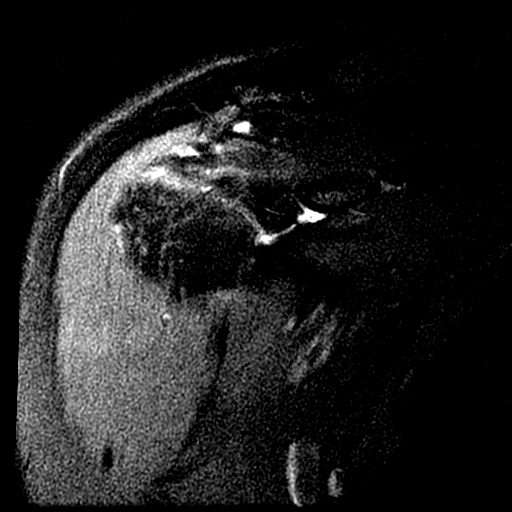
[im 10/19]
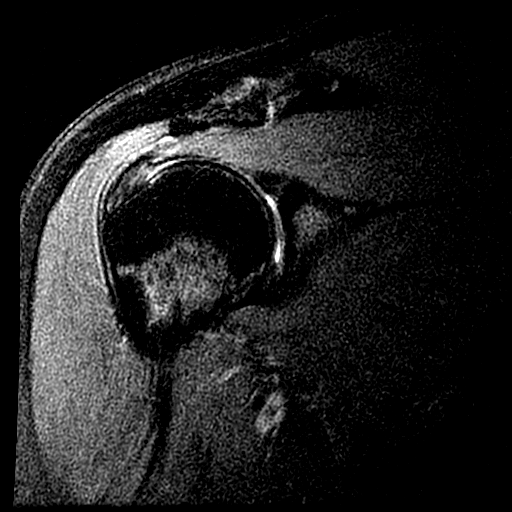
[im 16/19]
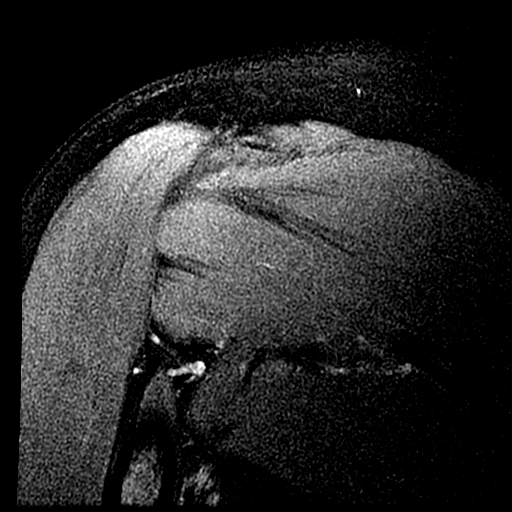

[Series 3: PD · coronal · 4.0mm · 0.27mm/px · 8 of 19 slices shown]
[im 1/19]
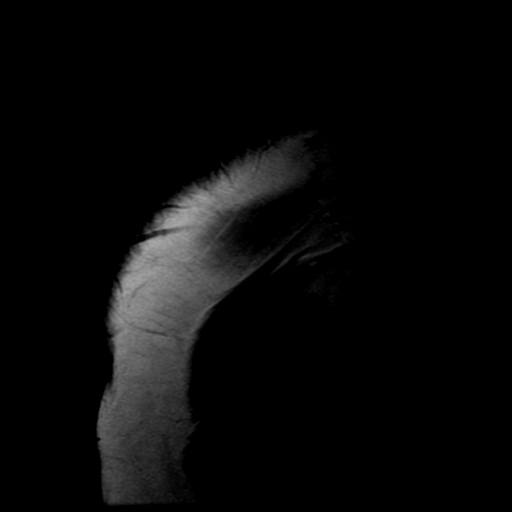
[im 3/19]
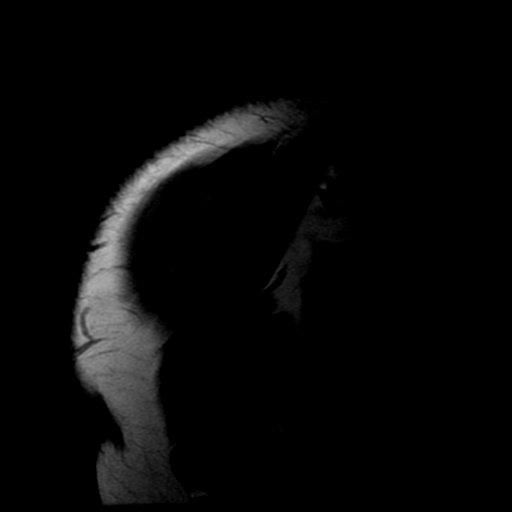
[im 6/19]
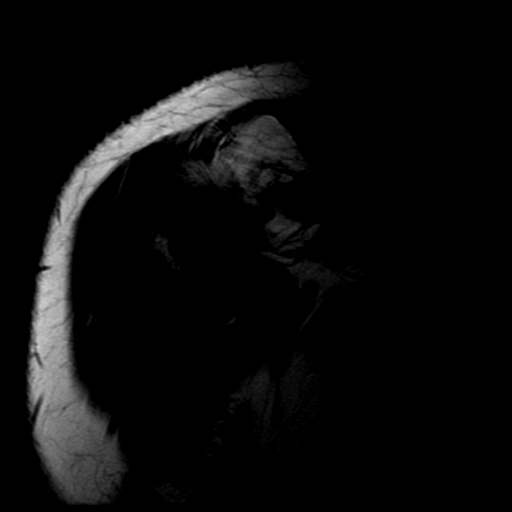
[im 8/19]
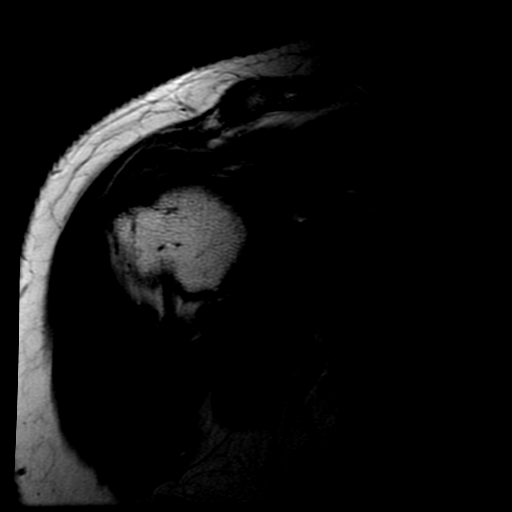
[im 11/19]
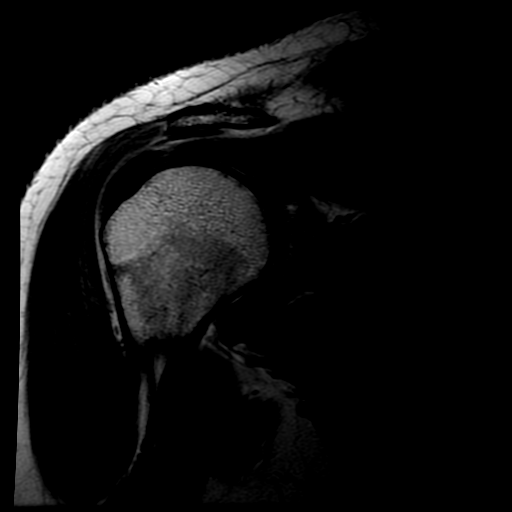
[im 13/19]
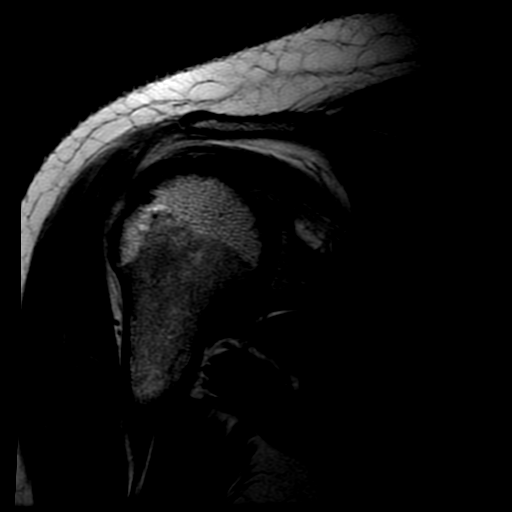
[im 16/19]
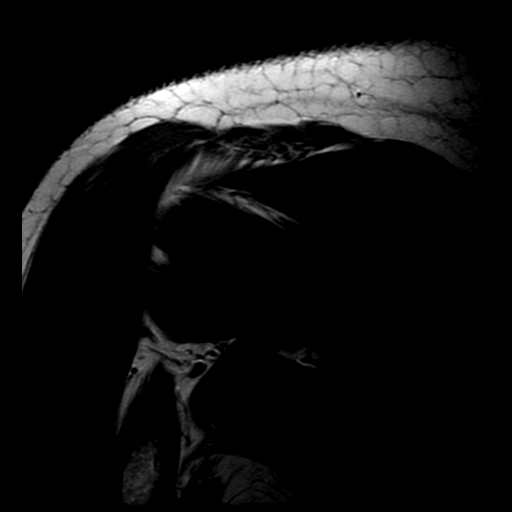
[im 19/19]
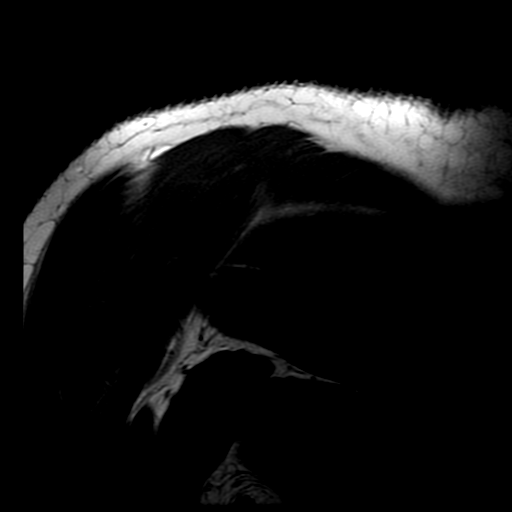

[Series 4: T2 fat-sat · axial · 4.0mm · 0.23mm/px · z∈[-50,+35]mm · 3 of 23 slices shown (2 of 3)]
[im 3/23]
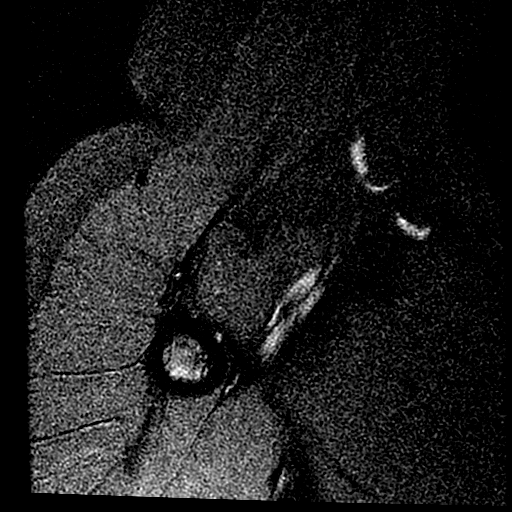
[im 12/23]
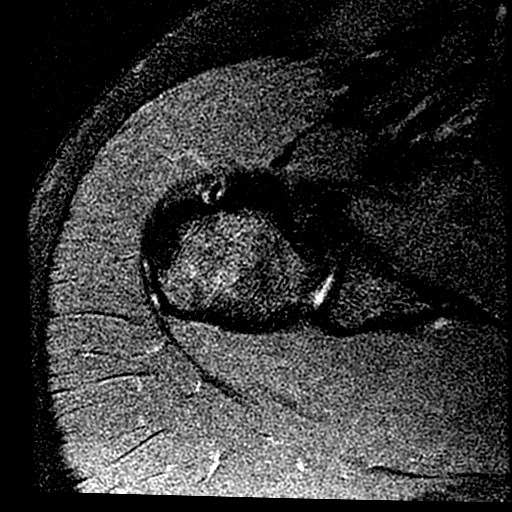
[im 20/23]
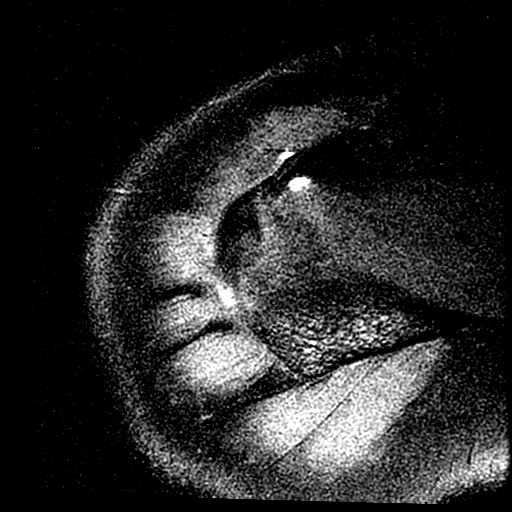

[Series 6: T2 fat-sat · coronal · 4.0mm · 0.27mm/px · 3 of 19 slices shown (3 of 3)]
[im 3/19]
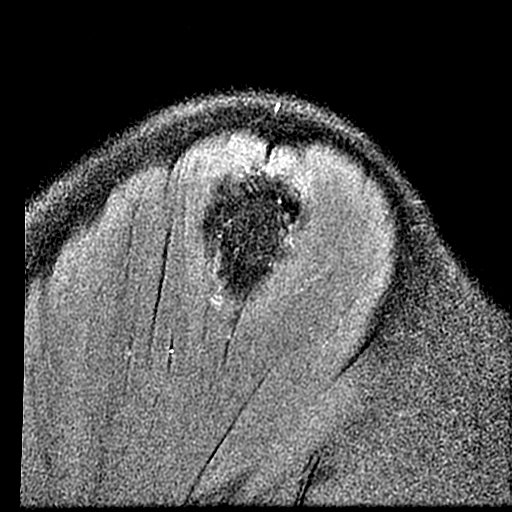
[im 11/19]
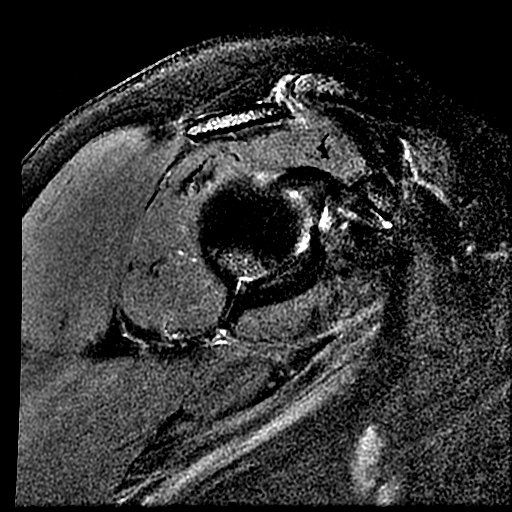
[im 16/19]
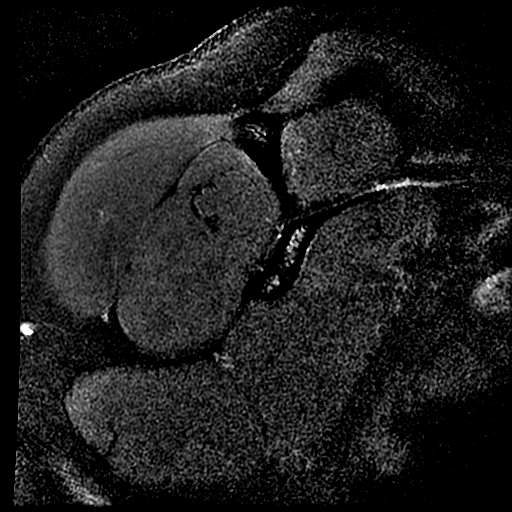

[19 of 40 positions shown; findings below may reference images not displayed]

FINDINGS: Rotator cuff: There is tendinosis of the supraspinatus tendon. The
rotator cuff is otherwise normal.

Muscles:  Normal.

Biceps long head:  Properly located and intact.

Acromioclavicular Joint: No arthritic changes. Slight edema in the
distal clavicle. No bursitis. Normal type 2 acromion.

Glenohumeral Joint: Normal.

Labrum:  Normal.

Bones: Tiny subcortical cysts in the posterior aspect of the greater
tuberosity.
IMPRESSION: Tendinosis of the distal supraspinatus tendon. Minimal edema in the
distal clavicle, nonspecific.

## 2016-06-15 ENCOUNTER — Telehealth: Payer: Self-pay | Admitting: Gastroenterology

## 2016-06-15 NOTE — Telephone Encounter (Signed)
Left vm to confirm 06/19/16 appt

## 2016-06-19 ENCOUNTER — Ambulatory Visit: Payer: Self-pay | Admitting: Gastroenterology

## 2016-07-20 ENCOUNTER — Telehealth: Payer: Self-pay | Admitting: Gastroenterology

## 2016-07-20 NOTE — Telephone Encounter (Signed)
Ms. Leslie Holder is requesting a return call to review her recent test results.  (has not established with primary care yet)    What type of test was done? US abdomen  imaging    [facility]    On what day was the test done? 05-05-16    Please return the patients call at [phone].   Leave message if she is not available on return call.

## 2016-07-21 NOTE — Telephone Encounter (Signed)
Left message with interpreter services for pt to return call (sign language)

## 2016-07-26 NOTE — Telephone Encounter (Signed)
Unable to reach pt by telephone; ltr sent

## 2016-08-10 ENCOUNTER — Telehealth: Payer: Self-pay | Admitting: Gastroenterology

## 2016-08-10 NOTE — Telephone Encounter (Signed)
Ms. Anabel BeneGrushie is returning a call from Vernona RiegerLaura. She states this is regarding results. She is requesting a call back at [phone].

## 2016-08-11 NOTE — Telephone Encounter (Signed)
Called pt and gave her ultrasound results. Pt questions why she is still bloated. Per OV note, pt was to have blood work and Lactose HBT. Pt states she no longer lives in [place] but in [place] and need her records transferred. Told pt to establish with a GI MD there and contact us so we can send records.

## 8387-03-04 DEATH — deceased
